# Patient Record
Sex: Male | Born: 1955 | Race: White | Hispanic: No | Marital: Married | Smoking: Never smoker
Health system: Southern US, Community
[De-identification: ages and names within clinical notes are randomized; demographics above are authoritative.]

## PROBLEM LIST (undated history)

## (undated) DIAGNOSIS — G4730 Sleep apnea, unspecified: Secondary | ICD-10-CM

## (undated) DIAGNOSIS — I10 Essential (primary) hypertension: Secondary | ICD-10-CM

## (undated) DIAGNOSIS — I341 Nonrheumatic mitral (valve) prolapse: Secondary | ICD-10-CM

## (undated) DIAGNOSIS — M1990 Unspecified osteoarthritis, unspecified site: Secondary | ICD-10-CM

## (undated) DIAGNOSIS — I742 Embolism and thrombosis of arteries of the upper extremities: Secondary | ICD-10-CM

## (undated) DIAGNOSIS — E785 Hyperlipidemia, unspecified: Secondary | ICD-10-CM

## (undated) DIAGNOSIS — E119 Type 2 diabetes mellitus without complications: Secondary | ICD-10-CM

## (undated) DIAGNOSIS — D689 Coagulation defect, unspecified: Secondary | ICD-10-CM

## (undated) DIAGNOSIS — C4491 Basal cell carcinoma of skin, unspecified: Secondary | ICD-10-CM

## (undated) DIAGNOSIS — J189 Pneumonia, unspecified organism: Secondary | ICD-10-CM

## (undated) DIAGNOSIS — F32A Depression, unspecified: Secondary | ICD-10-CM

## (undated) DIAGNOSIS — F419 Anxiety disorder, unspecified: Secondary | ICD-10-CM

## (undated) DIAGNOSIS — I493 Ventricular premature depolarization: Secondary | ICD-10-CM

## (undated) HISTORY — DX: Embolism and thrombosis of arteries of the upper extremities: I74.2

## (undated) HISTORY — PX: KNEE SURGERY: SHX244

## (undated) HISTORY — DX: Coagulation defect, unspecified: D68.9

## (undated) HISTORY — DX: Essential (primary) hypertension: I10

## (undated) HISTORY — PX: BRAIN SURGERY: SHX531

## (undated) HISTORY — PX: OTHER SURGICAL HISTORY: SHX169

## (undated) HISTORY — DX: Basal cell carcinoma of skin, unspecified: C44.91

---

## 2003-01-05 ENCOUNTER — Encounter: Admission: RE | Admit: 2003-01-05 | Discharge: 2003-01-05 | Payer: Self-pay

## 2004-03-10 ENCOUNTER — Ambulatory Visit (HOSPITAL_COMMUNITY): Admission: RE | Admit: 2004-03-10 | Discharge: 2004-03-10 | Payer: Self-pay | Admitting: Orthopedic Surgery

## 2004-03-10 ENCOUNTER — Ambulatory Visit (HOSPITAL_BASED_OUTPATIENT_CLINIC_OR_DEPARTMENT_OTHER): Admission: RE | Admit: 2004-03-10 | Discharge: 2004-03-10 | Payer: Self-pay | Admitting: Orthopedic Surgery

## 2004-05-17 ENCOUNTER — Ambulatory Visit (HOSPITAL_COMMUNITY): Admission: RE | Admit: 2004-05-17 | Discharge: 2004-05-17 | Payer: Self-pay | Admitting: Cardiology

## 2008-07-22 ENCOUNTER — Other Ambulatory Visit: Payer: Self-pay

## 2008-07-22 ENCOUNTER — Emergency Department: Payer: Self-pay | Admitting: Emergency Medicine

## 2010-10-26 DIAGNOSIS — Z85828 Personal history of other malignant neoplasm of skin: Secondary | ICD-10-CM

## 2010-10-26 HISTORY — DX: Personal history of other malignant neoplasm of skin: Z85.828

## 2012-11-07 ENCOUNTER — Ambulatory Visit: Payer: Self-pay | Admitting: Gastroenterology

## 2015-03-07 ENCOUNTER — Encounter: Payer: Self-pay | Admitting: Podiatry

## 2015-03-07 ENCOUNTER — Ambulatory Visit (INDEPENDENT_AMBULATORY_CARE_PROVIDER_SITE_OTHER): Payer: BLUE CROSS/BLUE SHIELD | Admitting: Podiatry

## 2015-03-07 VITALS — BP 176/95 | HR 78 | Resp 15 | Ht 70.0 in | Wt 230.0 lb

## 2015-03-07 DIAGNOSIS — L600 Ingrowing nail: Secondary | ICD-10-CM | POA: Diagnosis not present

## 2015-03-07 NOTE — Progress Notes (Signed)
   Subjective:    Patient ID: Juan Olson, male    DOB: [birth year], 59 y.o.   MRN: [id number]  HPI Comments: Pt complains of painful left 1st toenail medial border for over 1 month and worsening.     Review of Systems  All other systems reviewed and are negative.      Objective:   Physical Exam        Assessment & Plan:

## 2015-03-07 NOTE — Patient Instructions (Signed)

## 2015-03-08 NOTE — Progress Notes (Signed)
Subjective:     Patient ID: Juan Olson, male   DOB: 01-May-1956, 59 y.o.   MRN: [id number]  HPI patient presents with incurvated nail border left hallux medial side stating that it's been sore and making walking difficult   Review of Systems  All other systems reviewed and are negative.      Objective:   Physical Exam  Cardiovascular: Intact distal pulses.   Musculoskeletal: Normal range of motion.  Neurological: He is alert.  Skin: Skin is warm.  Nursing note and vitals reviewed.  neurovascular status intact with muscle strength adequate and range of motion subtalar midtarsal joint within normal limits. Patient's noted to have incurvated left hallux medial border that's painful when pressed and making shoe gear difficult and has tried wider shoes and soaks in order to try to alleviate pain. Good digital perfusion and is noted and patient well oriented 3     Assessment:     Ingrown toenail deformity left hallux medial border with pain    Plan:     H&P and condition reviewed with patient. Recommended removal of corner and explained procedure and patient understands and wants procedure understanding risk. Today I went ahead and infiltrated the left hallux 60 Milligan times like Marcaine mixture remove the medial border exposed matrix and applied phenol 3 applications 30 seconds followed by alcohol lavage and sterile dressing. Gave instructions on soaks and reappoint

## 2015-03-09 ENCOUNTER — Telehealth: Payer: Self-pay | Admitting: *Deleted

## 2015-03-09 ENCOUNTER — Encounter: Payer: Self-pay | Admitting: Podiatry

## 2015-03-09 NOTE — Telephone Encounter (Signed)
Pt requests note excusing him from work tonight due to the pain from an ingrown toenail procedure on 03/07/2015.

## 2015-04-11 ENCOUNTER — Ambulatory Visit (INDEPENDENT_AMBULATORY_CARE_PROVIDER_SITE_OTHER): Payer: BLUE CROSS/BLUE SHIELD | Admitting: Podiatry

## 2015-04-11 ENCOUNTER — Encounter: Payer: Self-pay | Admitting: Podiatry

## 2015-04-11 VITALS — BP 139/96 | HR 90 | Resp 15

## 2015-04-11 DIAGNOSIS — L600 Ingrowing nail: Secondary | ICD-10-CM | POA: Diagnosis not present

## 2015-04-14 NOTE — Progress Notes (Signed)
Subjective:     Patient ID: Juan Olson, male   DOB: [birth year], 59 y.o.   MRN: [id number]  HPI patient presents stating it's doing pretty well but I was just concerned because her a small amount of drainage   Review of Systems     Objective:   Physical Exam Neurovascular status intact with localized drainage noted left hallux with no proximal edema erythema drainage noted.    Assessment:     Localized paronychia infection left hallux    Plan:     Instructed on changes and soaks and open toed shoes at home and allowing air to get to it. Should heal uneventfully and reappoint as needed

## 2015-07-01 ENCOUNTER — Ambulatory Visit (INDEPENDENT_AMBULATORY_CARE_PROVIDER_SITE_OTHER): Payer: BLUE CROSS/BLUE SHIELD | Admitting: Podiatry

## 2015-07-01 ENCOUNTER — Encounter: Payer: Self-pay | Admitting: Podiatry

## 2015-07-01 VITALS — BP 152/95 | HR 76 | Resp 14

## 2015-07-01 DIAGNOSIS — L600 Ingrowing nail: Secondary | ICD-10-CM

## 2015-07-01 NOTE — Patient Instructions (Addendum)

## 2015-07-06 NOTE — Progress Notes (Signed)
Patient ID: Juan Olson, male   DOB: 1955-12-21, 59 y.o.   MRN: [id number]  Subjective: 59 year old male presents the office with continuation of pain and ingrown toenails of the left hallux. He states the continued his pain but his pressure in shoe gear. He does state that he noticed some drainage a couple days ago with some clear drainage but denies any this time. Denies any pus. Denies any surrounding redness or red streaks or any swelling. He started trimmed area without any relief. No other complaints at this time in no acute changes.  Objective: AAO x3, NAD DP/PT pulses palpable bilaterally, CRT less than 3 seconds Protective sensation intact with Simms Weinstein monofilament Along the medial and lateral aspect left hallux toenail there is significant incurvation tenderness to palpation along the nail borders. There is no overlying edema, erythema, increased warmth. There is no drainage or purulence. The nail does appear to be hypertrophic, dystrophic, discolored. They're significant curving of the toenail. No otherareas of tenderness to bilateral lower extremities. MMT 5/5, ROM WNL.  No open lesions or pre-ulcerative lesions.  No overlying edema, erythema, increase in warmth to bilateral lower extremities.  No pain with calf compression, swelling, warmth, erythema bilaterally.   Assessment: 59 year old male ingrown toenail left medial/lateral nail border.  Plan: -Treatment options discussed including all alternatives, risks, and complications -At this time, the patient is requesting partial nail removal with chemical matricectomy to the symptomatic portion of the nail. I discussed in total nail avulsion however he does not want to proceed with that only partial nail avulsion. He understands that he may require or need a total nail avulsion in the future if his condition reoccurring problem given deformity to the toenail.Risks and complications were discussed with the patient for which  they understand and  verbally consent to the procedure. Under sterile conditions a total of 3 mL of a mixture of 2% lidocaine plain and 0.5% Marcaine plain was infiltrated in a hallux block fashion. Once anesthetized, the skin was prepped in sterile fashion. A tourniquet was then applied. Next the medial and lateral aspect of hallux nail border was then sharply excised making sure to remove the entire offending nail border. Once the nails were ensured to be removed area was debrided and the underlying skin was intact. There is no purulence identified in the procedure. Next phenol was then applied under standard conditions and copiously irrigated. Silvadene was applied. A dry sterile dressing was applied. After application of the dressing the tourniquet was removed and there is found to be an immediate capillary refill time to the digit. The patient tolerated the procedure well any complications. Post procedure instructions were discussed the patient for which he verbally understood. Follow-up in one week for nail check or sooner if any problems are to arise. Discussed signs/symptoms of infection and directed to call the office immediately should any occur or go directly to the emergency room. In the meantime, encouraged to call the office with any questions, concerns, changes symptoms.  Celesta Gentile, DPM

## 2015-07-07 ENCOUNTER — Encounter: Payer: Self-pay | Admitting: Podiatry

## 2015-07-07 ENCOUNTER — Ambulatory Visit (INDEPENDENT_AMBULATORY_CARE_PROVIDER_SITE_OTHER): Payer: BLUE CROSS/BLUE SHIELD | Admitting: Podiatry

## 2015-07-07 VITALS — BP 152/99 | HR 76 | Resp 18

## 2015-07-07 DIAGNOSIS — Z9889 Other specified postprocedural states: Secondary | ICD-10-CM

## 2015-07-07 DIAGNOSIS — L600 Ingrowing nail: Secondary | ICD-10-CM

## 2015-07-07 NOTE — Patient Instructions (Signed)

## 2015-07-08 ENCOUNTER — Encounter: Payer: Self-pay | Admitting: Podiatry

## 2015-07-08 NOTE — Progress Notes (Signed)
Patient ID: Juan Olson, male   DOB: Mar 20, 1956, 59 y.o.   MRN: [id number]  Subjective: 59 year old male presents the office today status post left medial/lateral partial nail avulsion. He states his been soaking his foot in Epson salt soaks cover with antibiotic ointment and a Band-Aid. He states that he is doing better and the pain is significantly improved compared to what it was previously. He denies any surrounding redness or red streaks. Denies any pus. There is minimal bloody drainage at times. No other complaints at this time. Denies any systemic complaints as fevers, chills, nausea, vomiting.  Objective: AAO 3, NAD Neurovascular status intact and unchanged. Status post medial and lateral partial nail avulsion of the left hallux. There is granulation tissue is present within the procedure site. There is no tenderness palpation overlying the area. There is no swelling erythema, edema, increase in warmth, red streaks. No conical signs of infection. No other open lesions or pre-ulcerative lesions. No pain with calf compression, swelling, warmth, erythema.  Assessment: s/p left hallux medial/lateral partial nail avulsion; doing well.   Plan: Continue soaking in epsom salts twice a day followed by antibiotic ointment and a band-aid. Can leave uncovered at night. Continue this until completely healed.  If the area has not healed in 2 weeks, call the office for follow-up appointment, or sooner if any problems arise.  Monitor for any signs/symptoms of infection. Call the office immediately if any occur or go directly to the emergency room. Call with any questions/concerns.  Celesta Gentile, DPM

## 2015-10-27 ENCOUNTER — Other Ambulatory Visit: Payer: Self-pay | Admitting: Orthopedic Surgery

## 2015-10-27 DIAGNOSIS — M5442 Lumbago with sciatica, left side: Secondary | ICD-10-CM

## 2015-10-27 DIAGNOSIS — M5136 Other intervertebral disc degeneration, lumbar region: Secondary | ICD-10-CM

## 2015-11-03 ENCOUNTER — Ambulatory Visit
Admission: RE | Admit: 2015-11-03 | Discharge: 2015-11-03 | Disposition: A | Discharge status: Home / Self Care | Payer: BLUE CROSS/BLUE SHIELD | Source: Ambulatory Visit | Attending: Orthopedic Surgery | Admitting: Orthopedic Surgery

## 2015-11-03 DIAGNOSIS — M4806 Spinal stenosis, lumbar region: Secondary | ICD-10-CM | POA: Insufficient documentation

## 2015-11-03 DIAGNOSIS — I708 Atherosclerosis of other arteries: Secondary | ICD-10-CM | POA: Insufficient documentation

## 2015-11-03 DIAGNOSIS — M5137 Other intervertebral disc degeneration, lumbosacral region: Secondary | ICD-10-CM | POA: Insufficient documentation

## 2015-11-03 DIAGNOSIS — M5442 Lumbago with sciatica, left side: Secondary | ICD-10-CM | POA: Insufficient documentation

## 2015-11-03 DIAGNOSIS — M5136 Other intervertebral disc degeneration, lumbar region: Secondary | ICD-10-CM

## 2016-04-24 ENCOUNTER — Other Ambulatory Visit: Payer: Self-pay | Admitting: Unknown Physician Specialty

## 2016-04-24 ENCOUNTER — Ambulatory Visit
Admission: RE | Admit: 2016-04-24 | Discharge: 2016-04-24 | Disposition: A | Discharge status: Home / Self Care | Payer: BLUE CROSS/BLUE SHIELD | Source: Ambulatory Visit | Attending: Unknown Physician Specialty | Admitting: Unknown Physician Specialty

## 2016-04-24 DIAGNOSIS — J329 Chronic sinusitis, unspecified: Secondary | ICD-10-CM
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
## 2017-04-16 DIAGNOSIS — K625 Hemorrhage of anus and rectum: Secondary | ICD-10-CM | POA: Insufficient documentation

## 2017-10-25 ENCOUNTER — Encounter: Payer: Self-pay | Admitting: *Deleted

## 2017-10-31 ENCOUNTER — Ambulatory Visit
Admission: RE | Admit: 2017-10-31 | Payer: BLUE CROSS/BLUE SHIELD | Source: Ambulatory Visit | Admitting: Gastroenterology

## 2017-10-31 ENCOUNTER — Encounter: Admission: RE | Payer: Self-pay | Source: Ambulatory Visit

## 2017-10-31 HISTORY — DX: Hyperlipidemia, unspecified: E78.5

## 2017-10-31 HISTORY — DX: Nonrheumatic mitral (valve) prolapse: I34.1

## 2017-10-31 SURGERY — COLONOSCOPY WITH PROPOFOL
Anesthesia: General

## 2018-11-18 ENCOUNTER — Ambulatory Visit: Payer: Self-pay | Admitting: Podiatry

## 2018-11-21 DIAGNOSIS — E669 Obesity, unspecified: Secondary | ICD-10-CM | POA: Insufficient documentation

## 2018-11-28 ENCOUNTER — Ambulatory Visit: Payer: Self-pay | Admitting: Podiatry

## 2018-11-28 ENCOUNTER — Ambulatory Visit: Payer: No Typology Code available for payment source | Admitting: Podiatry

## 2018-11-28 ENCOUNTER — Encounter: Payer: Self-pay | Admitting: Podiatry

## 2018-11-28 DIAGNOSIS — M79674 Pain in right toe(s): Secondary | ICD-10-CM | POA: Diagnosis not present

## 2018-11-28 DIAGNOSIS — M79675 Pain in left toe(s): Secondary | ICD-10-CM | POA: Diagnosis not present

## 2018-11-28 DIAGNOSIS — B351 Tinea unguium: Secondary | ICD-10-CM

## 2018-11-28 DIAGNOSIS — L600 Ingrowing nail: Secondary | ICD-10-CM | POA: Diagnosis not present

## 2018-11-28 NOTE — Progress Notes (Signed)
Subjective: Juan Olson presents to the office today for concerns of ingrown toenail on the right big toe, medial aspect.  States is not hurting but he cannot trim the nail he cannot get the corner out he like me to trim the nail.  Denies any current pain he denies any redness or drainage or any swelling to the area.  He has no other concerns. Denies any systemic complaints such as fevers, chills, nausea, vomiting. No acute changes since last appointment, and no other complaints at this time.   Objective: AAO x3, NAD DP/PT pulses palpable bilaterally, CRT less than 3 seconds The nails overall are discolored with yellow discoloration and dystrophic.  The right hallux was mildly hypertrophic but the other nails.  There is mild incurvation present on the medial aspect distally there is no edema, erythema, drainage or pus or any clinical signs of infection.  No open lesions or pre-ulcerative lesions.  No pain with calf compression, swelling, warmth, erythema  Assessment: Right hallux ingrown toenail  Plan: -All treatment options discussed with the patient including all alternatives, risks, complications.  -Discussed partial nail avulsion but he wishes to hold off.  Today he wants to have the nail corner trimmed.  I sharply debrided the nail without any complications or bleeding.  I do Mucinex and salt soaks daily as well as antibiotic ointment for monitoring signs or symptoms of infection. -Patient encouraged to call the office with any questions, concerns, change in symptoms.   Trula Slade DPM

## 2018-11-28 NOTE — Patient Instructions (Signed)
Ingrown Toenail  An ingrown toenail occurs when the corner or sides of a toenail grow into the surrounding skin. This causes discomfort and pain. The big toe is most commonly affected, but any of the toes can be affected. If an ingrown toenail is not treated, it can become infected.  What are the causes?  This condition may be caused by:   Wearing shoes that are too small or tight.   An injury, such as stubbing your toe or having your toe stepped on.   Improper cutting or care of your toenails.   Having nail or foot abnormalities that were present from birth (congenital abnormalities), such as having a nail that is too big for your toe.  What increases the risk?  The following factors may make you more likely to develop ingrown toenails:   Age. Nails tend to get thicker with age, so ingrown nails are more common among older people.   Cutting your toenails incorrectly, such as cutting them very short or cutting them unevenly.  An ingrown toenail is more likely to get infected if you have:   Diabetes.   Blood flow (circulation) problems.  What are the signs or symptoms?  Symptoms of an ingrown toenail may include:   Pain, soreness, or tenderness.   Redness.   Swelling.   Hardening of the skin that surrounds the toenail.  Signs that an ingrown toenail may be infected include:   Fluid or pus.   Symptoms that get worse instead of better.  How is this diagnosed?  An ingrown toenail may be diagnosed based on your medical history, your symptoms, and a physical exam. If you have fluid or blood coming from your toenail, a sample may be collected to test for the specific type of bacteria that is causing the infection.  How is this treated?  Treatment depends on how severe your ingrown toenail is. You may be able to care for your toenail at home.   If you have an infection, you may be prescribed antibiotic medicines.   If you have fluid or pus draining from your toenail, your health care provider may drain  it.   If you have trouble walking, you may be given crutches to use.   If you have a severe or infected ingrown toenail, you may need a procedure to remove part or all of the nail.  Follow these instructions at home:  Foot care     Do not pick at your toenail or try to remove it yourself.   Soak your foot in warm, soapy water. Do this for 20 minutes, 3 times a day, or as often as told by your health care provider. This helps to keep your toe clean and keep your skin soft.   Wear shoes that fit well and are not too tight. Your health care provider may recommend that you wear open-toed shoes while you heal.   Trim your toenails regularly and carefully. Cut your toenails straight across to prevent injury to the skin at the corners of the toenail. Do not cut your nails in a curved shape.   Keep your feet clean and dry to help prevent infection.  Medicines   Take over-the-counter and prescription medicines only as told by your health care provider.   If you were prescribed an antibiotic, take it as told by your health care provider. Do not stop taking the antibiotic even if you start to feel better.  Activity   Return to your normal activities   as told by your health care provider. Ask your health care provider what activities are safe for you.   Avoid activities that cause pain.  General instructions   If your health care provider told you to use crutches to help you move around, use them as instructed.   Keep all follow-up visits as told by your health care provider. This is important.  Contact a health care provider if:   You have more redness, swelling, pain, or other symptoms that do not improve with treatment.   You have fluid, blood, or pus coming from your toenail.  Get help right away if:   You have a red streak on your skin that starts at your foot and spreads up your leg.   You have a fever.  Summary   An ingrown toenail occurs when the corner or sides of a toenail grow into the surrounding  skin. This causes discomfort and pain. The big toe is most commonly affected, but any of the toes can be affected.   If an ingrown toenail is not treated, it can become infected.   Fluid or pus draining from your toenail is a sign of infection. Your health care provider may need to drain it. You may be given antibiotics to treat the infection.   Trimming your toenails regularly and properly can help you prevent an ingrown toenail.  This information is not intended to replace advice given to you by your health care provider. Make sure you discuss any questions you have with your health care provider.  Document Released: 10/19/2000 Document Revised: 07/10/2017 Document Reviewed: 07/10/2017  Elsevier Interactive Patient Education  2019 [employer]

## 2019-06-02 ENCOUNTER — Encounter: Payer: Self-pay | Admitting: Podiatry

## 2019-06-02 ENCOUNTER — Other Ambulatory Visit: Payer: Self-pay

## 2019-06-02 ENCOUNTER — Ambulatory Visit: Payer: No Typology Code available for payment source | Admitting: Podiatry

## 2019-06-02 VITALS — Temp 97.8°F

## 2019-06-02 DIAGNOSIS — L600 Ingrowing nail: Secondary | ICD-10-CM | POA: Diagnosis not present

## 2019-06-02 NOTE — Patient Instructions (Signed)
Ingrown Toenail An ingrown toenail occurs when the corner or sides of a toenail grow into the surrounding skin. This causes discomfort and pain. The big toe is most commonly affected, but any of the toes can be affected. If an ingrown toenail is not treated, it can become infected. What are the causes? This condition may be caused by:  Wearing shoes that are too small or tight.  An injury, such as stubbing your toe or having your toe stepped on.  Improper cutting or care of your toenails.  Having nail or foot abnormalities that were present from birth (congenital abnormalities), such as having a nail that is too big for your toe. What increases the risk? The following factors may make you more likely to develop ingrown toenails:  Age. Nails tend to get thicker with age, so ingrown nails are more common among older people.  Cutting your toenails incorrectly, such as cutting them very short or cutting them unevenly. An ingrown toenail is more likely to get infected if you have:  Diabetes.  Blood flow (circulation) problems. What are the signs or symptoms? Symptoms of an ingrown toenail may include:  Pain, soreness, or tenderness.  Redness.  Swelling.  Hardening of the skin that surrounds the toenail. Signs that an ingrown toenail may be infected include:  Fluid or pus.  Symptoms that get worse instead of better. How is this diagnosed? An ingrown toenail may be diagnosed based on your medical history, your symptoms, and a physical exam. If you have fluid or blood coming from your toenail, a sample may be collected to test for the specific type of bacteria that is causing the infection. How is this treated? Treatment depends on how severe your ingrown toenail is. You may be able to care for your toenail at home.  If you have an infection, you may be prescribed antibiotic medicines.  If you have fluid or pus draining from your toenail, your health care provider may drain it.   If you have trouble walking, you may be given crutches to use.  If you have a severe or infected ingrown toenail, you may need a procedure to remove part or all of the nail. Follow these instructions at home: Foot care   Do not pick at your toenail or try to remove it yourself.  Soak your foot in warm, soapy water. Do this for 20 minutes, 3 times a day, or as often as told by your health care provider. This helps to keep your toe clean and keep your skin soft.  Wear shoes that fit well and are not too tight. Your health care provider may recommend that you wear open-toed shoes while you heal.  Trim your toenails regularly and carefully. Cut your toenails straight across to prevent injury to the skin at the corners of the toenail. Do not cut your nails in a curved shape.  Keep your feet clean and dry to help prevent infection. Medicines  Take over-the-counter and prescription medicines only as told by your health care provider.  If you were prescribed an antibiotic, take it as told by your health care provider. Do not stop taking the antibiotic even if you start to feel better. Activity  Return to your normal activities as told by your health care provider. Ask your health care provider what activities are safe for you.  Avoid activities that cause pain. General instructions  If your health care provider told you to use crutches to help you move around, use them   as instructed.  Keep all follow-up visits as told by your health care provider. This is important. Contact a health care provider if:  You have more redness, swelling, pain, or other symptoms that do not improve with treatment.  You have fluid, blood, or pus coming from your toenail. Get help right away if:  You have a red streak on your skin that starts at your foot and spreads up your leg.  You have a fever. Summary  An ingrown toenail occurs when the corner or sides of a toenail grow into the surrounding skin.  This causes discomfort and pain. The big toe is most commonly affected, but any of the toes can be affected.  If an ingrown toenail is not treated, it can become infected.  Fluid or pus draining from your toenail is a sign of infection. Your health care provider may need to drain it. You may be given antibiotics to treat the infection.  Trimming your toenails regularly and properly can help you prevent an ingrown toenail. This information is not intended to replace advice given to you by your health care provider. Make sure you discuss any questions you have with your health care provider. Document Released: 10/19/2000 Document Revised: 02/13/2019 Document Reviewed: 07/10/2017 Elsevier Patient Education  2020 [employer].

## 2019-06-08 NOTE — Progress Notes (Signed)
Subjective: 63 year old male presents the office today for follow-up evaluation of ingrown toenail.  He states that his nail onset of concrete last Friday and since then he started noticing reoccurrence of ingrown toenail.  After that point he was doing well having no issues with the toenail.  He thinks it just needs to be trimmed out. Denies any systemic complaints such as fevers, chills, nausea, vomiting. No acute changes since last appointment, and no other complaints at this time.   Objective: AAO x3, NAD DP/PT pulses palpable bilaterally, CRT less than 3 seconds Mild incurvation present to the distal aspect of the right hallux toenail on the medial aspect.  There is no edema, erythema.  No drainage or pus or any signs of infection.  No open lesions or pre-ulcerative lesions.  No pain with calf compression, swelling, warmth, erythema  Assessment: Ingrown toenail right medial  Plan: -All treatment options discussed with the patient including all alternatives, risks, complications.  -Discussed with a partial nail avulsion but he wants to hold off on that.  He was doing well up until he hit his toe.  I did sharply debride the symptomatic portion of the ingrown toenail with any complications or bleeding.  Recommend Epson salt soaks daily as well as antibiotic ointment dressing changes.  Monitor for any signs or symptoms of infection. -Patient encouraged to call the office with any questions, concerns, change in symptoms.   Trula Slade DPM

## 2019-08-28 ENCOUNTER — Other Ambulatory Visit: Payer: Self-pay

## 2019-08-28 ENCOUNTER — Encounter: Payer: Self-pay | Admitting: Podiatry

## 2019-08-28 ENCOUNTER — Ambulatory Visit: Payer: Managed Care, Other (non HMO) | Admitting: Podiatry

## 2019-08-28 DIAGNOSIS — L600 Ingrowing nail: Secondary | ICD-10-CM | POA: Diagnosis not present

## 2019-08-28 NOTE — Progress Notes (Signed)
Subjective: 63 year old male presents the office today for recurrent ingrown toenail the right big toe, medial aspect.  Been causing pain daily about several days.  I have seen him previously to have the nail trimmed.  He is interested in have the nail point removed today.Denies any systemic complaints such as fevers, chills, nausea, vomiting. No acute changes since last appointment, and no other complaints at this time.   Objective: AAO x3, NAD DP/PT pulses palpable bilaterally, CRT less than 3 seconds Nails highly hypertrophic and dystrophic.  Incurvation present medial aspect of the right hallux toenail with localized edema but there is no drainage or pus or any fluctuation crepitation any open sores.  No pain with calf compression, swelling, warmth, erythema  Assessment: Chronic ingrown toenail right medial  Plan: -All treatment options discussed with the patient including all alternatives, risks, complications.  -At this time, the patient is requesting partial nail removal with chemical matricectomy to the symptomatic portion of the nail. Risks and complications were discussed with the patient for which they understand and written consent was obtained. Under sterile conditions a total of 3 mL of a mixture of 2% lidocaine plain and 0.5% Marcaine plain was infiltrated in a hallux block fashion. Once anesthetized, the skin was prepped in sterile fashion. A tourniquet was then applied. Next the medial aspect of hallux nail border was then sharply excised making sure to remove the entire offending nail border. Once the nails were ensured to be removed area was debrided and the underlying skin was intact. There is no purulence identified in the procedure. Next phenol was then applied under standard conditions and copiously irrigated. Silvadene was applied. A dry sterile dressing was applied. After application of the dressing the tourniquet was removed and there is found to be an immediate capillary  refill time to the digit. The patient tolerated the procedure well any complications. Post procedure instructions were discussed the patient for which he verbally understood. Follow-up in one week for nail check or sooner if any problems are to arise. Discussed signs/symptoms of infection and directed to call the office immediately should any occur or go directly to the emergency room. In the meantime, encouraged to call the office with any questions, concerns, changes symptoms. -He has been trim the left hallux toenail treated today as a courtesy.  This was completed without any complications or bleeding. -Patient encouraged to call the office with any questions, concerns, change in symptoms.   Trula Slade DPM

## 2019-08-28 NOTE — Patient Instructions (Signed)

## 2019-09-07 ENCOUNTER — Ambulatory Visit (INDEPENDENT_AMBULATORY_CARE_PROVIDER_SITE_OTHER): Payer: Medicare Other | Admitting: Podiatry

## 2019-09-07 ENCOUNTER — Other Ambulatory Visit: Payer: Self-pay

## 2019-09-07 DIAGNOSIS — L600 Ingrowing nail: Secondary | ICD-10-CM

## 2019-09-07 NOTE — Patient Instructions (Signed)

## 2019-09-07 NOTE — Progress Notes (Signed)
Subjective: Juan Olson is a 63 y.o.  male returns to office today for follow up evaluation after having right hallux medial nail avulsion performed. Patient has been soaking using epsom salts and applying topical antibiotic covered with bandaid daily.  States he has some clear drainage for the first 2 days but that resolved.  No purulence.  His pain is also improving.  He has no concerns.  Patient denies fevers, chills, nausea, vomiting. Denies any calf pain, chest pain, SOB.   Objective:  Vitals: Reviewed  General: Well developed, nourished, in no acute distress, alert and oriented x3   Dermatology: Skin is warm, dry and supple bilateral. Right hallux nail border appears to be clean, dry, with mild granular tissue and surrounding scab. There is no surrounding erythema, edema, drainage/purulence. The remaining nails appear unremarkable at this time. There are no other lesions or other signs of infection present.  Neurovascular status: Intact. No lower extremity swelling; No pain with calf compression bilateral.  Musculoskeletal: No tenderness to palpation of the right hallux nail fold. Muscular strength within normal limits bilateral.   Assesement and Plan: S/p partial nail avulsion, doing well.   -Continue soaking in epsom salts twice a day followed by antibiotic ointment and a band-aid. Can leave uncovered at night. Continue this until completely healed.  -If the area has not healed in 2 weeks, call the office for follow-up appointment, or sooner if any problems arise.  -Monitor for any signs/symptoms of infection. Call the office immediately if any occur or go directly to the emergency room. Call with any questions/concerns.  Celesta Gentile, DPM

## 2019-09-08 ENCOUNTER — Ambulatory Visit: Payer: Managed Care, Other (non HMO) | Admitting: Podiatry

## 2020-03-08 ENCOUNTER — Ambulatory Visit: Payer: Medicare Other | Admitting: Podiatry

## 2020-03-08 ENCOUNTER — Other Ambulatory Visit: Payer: Self-pay

## 2020-03-08 DIAGNOSIS — B351 Tinea unguium: Secondary | ICD-10-CM | POA: Diagnosis not present

## 2020-03-08 DIAGNOSIS — M79674 Pain in right toe(s): Secondary | ICD-10-CM

## 2020-03-08 DIAGNOSIS — L603 Nail dystrophy: Secondary | ICD-10-CM

## 2020-03-08 DIAGNOSIS — M79675 Pain in left toe(s): Secondary | ICD-10-CM | POA: Diagnosis not present

## 2020-03-09 ENCOUNTER — Encounter: Payer: Self-pay | Admitting: Podiatry

## 2020-03-09 NOTE — Progress Notes (Signed)
  Subjective:  Patient ID: Juan Olson, male    DOB: 1956-05-29,  MRN: [id number]  Chief Complaint  Patient presents with  . Ankle Pain    pt is here for a possible ingrown of the right great toenail medial side, pt states that the it doesn't hurt, but is looking to get it trimmed down   64 y.o. male returns for the above complaint.  Patient presents with thickened elongated dystrophic toenails x10.  Patient states is mildly painful when ambulating.  He has not been able to debride himself down.  Patient states that there is no bruising or swelling associated with it.  He is also worried about the right great toenail that is ingrown and is causing some pain.  Patient was last seen by Dr. Earleen Newport who did a slant back procedure which seems to be helping.  He would like to know if this could be done again as it does give him a lot of pain relief.  He does not want to have it removed.  Objective:  There were no vitals filed for this visit. Podiatric Exam: Vascular: dorsalis pedis and posterior tibial pulses are palpable bilateral. Capillary return is immediate. Temperature gradient is WNL. Skin turgor WNL  Sensorium: Normal Semmes Weinstein monofilament test. Normal tactile sensation bilaterally. Nail Exam: Pt has thick disfigured discolored nails with subungual debris noted bilateral entire nail hallux through fifth toenails.  Pain on palpation to the nails.  Ingrown medial border of the right hallux noted.  No clinical signs of infection noted. Ulcer Exam: There is no evidence of ulcer or pre-ulcerative changes or infection. Orthopedic Exam: Muscle tone and strength are WNL. No limitations in general ROM. No crepitus or effusions noted. HAV  B/L.  Hammer toes 2-5  B/L. Skin: No Porokeratosis. No infection or ulcers    Assessment & Plan:   1. Nail dystrophy   2. Pain due to onychomycosis of toenails of both feet     Patient was evaluated and treated and all questions answered.  Right  hallux medial border ingrown/nail dystrophy -I explained to the patient the etiology of ingrown toenail and various treatment options were extensively discussed.  At this time patient would like to hold off on any ingrown nail procedure as he may be traveling in the near future.  He will discuss think about it and will return and we will may consider doing a procedure. -Slant back procedure was performed which gave him temporary relief.  No complications noted.  Onychomycosis with pain  -Nails palliatively debrided as below. -Educated on self-care  Procedure: Nail Debridement Rationale: pain  Type of Debridement: manual, sharp debridement. Instrumentation: Nail nipper, rotary burr. Number of Nails: 10  Procedures and Treatment: Consent by patient was obtained for treatment procedures. The patient understood the discussion of treatment and procedures well. All questions were answered thoroughly reviewed. Debridement of mycotic and hypertrophic toenails, 1 through 5 bilateral and clearing of subungual debris. No ulceration, no infection noted.  Return Visit-Office Procedure: Patient instructed to return to the office for a follow up visit 3 months for continued evaluation and treatment.  Boneta Lucks, DPM    No follow-ups on file.

## 2020-05-06 ENCOUNTER — Encounter: Payer: Medicare Other | Attending: Physician Assistant | Admitting: Dietician

## 2020-05-06 ENCOUNTER — Other Ambulatory Visit: Payer: Self-pay

## 2020-05-06 ENCOUNTER — Encounter: Payer: Self-pay | Admitting: Dietician

## 2020-05-06 VITALS — Ht 69.0 in | Wt 240.4 lb

## 2020-05-06 DIAGNOSIS — I10 Essential (primary) hypertension: Secondary | ICD-10-CM | POA: Diagnosis not present

## 2020-05-06 DIAGNOSIS — G4730 Sleep apnea, unspecified: Secondary | ICD-10-CM | POA: Diagnosis not present

## 2020-05-06 DIAGNOSIS — E119 Type 2 diabetes mellitus without complications: Secondary | ICD-10-CM | POA: Diagnosis present

## 2020-05-06 NOTE — Patient Instructions (Signed)
·   If you eat some bread, traditional sourdough (not quick processed), or sprouted grain bread are better for blood sugar control and have some probiotic benefit.   Great job including vegetables and friuts and other good sources of fiber and nutrition, and limiting processed carb/ starchy foods!  Aim for at least 15-30grams of carbohydrate with each meal, from fruit, whole grain, beans, healthy bread, etc. Some carb on a regular basis helps exercise benefit you more also with enhanced muscle development.

## 2020-05-06 NOTE — Progress Notes (Signed)
Medical Nutrition Therapy: Visit start time: 0272  end time: 1145  Assessment:  Diagnosis: Type 2 diabetes, HTN, sleep apnea Past medical history: hyperlipidemia Psychosocial issues/ stress concerns: PTSD  Preferred learning method:  . Auditory-- discussion .   Current weight: 240.4lbs  Height: 5'9" Medications, supplements: reconciled list in medical record; taking several supplements including probiotic, vitamin C, magnesium, chromium, turmeric, cinnamon, vitamin E, combo powder including vitamin c, lysine, and zinc  Progress and evaluation:   Patient reports increase in HbA1C recently, which he feels is due to his retirement and decrease in physical activity over the past year.   He has made diet change in the past several weeks and has resumed regular exercise. He reports weight loss of 8lbs.   He is not testing BGs at this time as regular checks were increasing his stress/ anxiety level. He plans to monitor HbA1C results over time as measure of BG control.  He has had diabetes and dietary education in the past, states he has basic knowledge of what he needs to eat.  Physical activity: recumbent bike 40 minutes, 6x a week  Dietary Intake:  Usual eating pattern includes 3 meals and 2-3 snacks per day. Dining out frequency: 0 meals per week.  Breakfast: drinks  + 1/2c fiber one cereal with blueberries and milk; atkins shake; oatmeal with berries; boiled eggs Snack: protein drink with fiber am; occasionally nuts Lunch: same options as breakfast, often Atkins shake if not at breakfast Snack: grapefruit; sometimes nuts Supper: meat + veg ie broccoli, cabbage, brussels sprouts, 3-bean salad + tossed salad; avoids potatoes, no fried foods or processed meats; occ pasta, limits portion Snack: mixed nuts and sunflower seeds Beverages: water, 4-6oz coffee with half&half in am, protein drinks; occ crystal light lemonade  Nutrition Care Education: Topics covered:  Basic nutrition: basic  food groups, appropriate nutrient balance, appropriate meal and snack schedule, general nutrition guidelines   Weight control: estimated energy needs for weight loss at 1800kcal, provided guidance for 40% CHO, 30% protein, 30% fat Diabetes: appropriate carb intake and balance, healthy carb choices, role of fiber, protein, fat; effects of exercise on BGs and mechanism of action  Nutritional Diagnosis:  [place]-2.2 Altered nutrition-related laboratory As related to type 2 diabetes.  As evidenced by recent HbA1C of 7.6%. -3.3 Overweight/obesity As related to history of excess calories and inadequate physical activity.  As evidenced by patient with current BMI of 35, following low-carb diet for ongoing weight loss as well as BG control.  Intervention:   Instruction and discussion as noted above.  Patient has made significant changes to improve BG control and lose weight; he is motivated to continue.   Encouraged inclusion of some healthy carb food(s) with each meal for more consistent intake and to meet basic nutritional needs. Provided plan for lower yet healthy level of carb intake.   Established goals for additional change with direction from patient.  No follow-up scheduled at this time; patient is planning to stop by for weekly weight checks and will schedule another visit later if needed.  Education Materials given:  . Plate Planner with food lists, sample meal pattern . Goals/ instructions   Learner/ who was taught:  . Patient   Level of understanding: Mar[place] Kitchen Verbalizes/ demonstrates competency   Demonstrated degree of understanding via:   Teach back Learning barriers: . None  Willingness to learn/ readiness for change: . Eager, change in progress   Monitoring and Evaluation:  Dietary intake, exercise, BG control, and body weight  follow up: prn

## 2020-05-13 ENCOUNTER — Encounter: Payer: Self-pay | Admitting: Dietician

## 2020-05-13 NOTE — Progress Notes (Signed)
Received and responded to email from patient; copy is below:  Hi Juan Olson, That is awesome! I'm so glad you are getting some pain relief! With less pain, your blood sugars are likely to improve also.  I will keep your good results in mind as I consult with others in similar situations.  Thanks for sharing, Juan Olson  -----Original Message----- From: Juan [email or web address]>  Sent: Friday, May 13, 2020 10:50 AM To: Juan [email or web address]> Subject: Cbd  *Caution - External email - see footer for warnings*  Good morning Juan Olson, saw you last Friday morning, on very creaky knees. I've been using CBD oil 16mg  per day and I'm completely without pain this morning, today is day 6. I've changed nothing else in my routine just this addition, my knees feel as good as they've been in years.  There's no THC in this bottle as well it's straight CBD. Just a note in case you have other patients that are desperate with bad knees or joints, if they're diabetic they're probably overweight like me and likely to have aches and pains. If my knees hurt I can't do the gym and it kills my whole week. I'm a real skeptic as well with things like this, but my knees tell me it's working, with zero side effects and I'm sleeping better as well. I've even knocked down my Motrin usage per day, it's not needed. I also discussed this with my Dr last year, but I dismissed it as snake oil at the time, but he saw no harm in trying it. So a year later....  Thanks, Juan Olson  Sent from my iPad WARNING: This email originated outside of [facility]. Even if this looks like a [employer], it is not. Do not provide your username, password, or any other personal information in response to this or any other email. [facility] will never ask you for your username or password via email. DO NOT CLICK links or attachments unless you are positive the content is safe. If in doubt about the safety of this message, select  the [employer] button, which forwards to IT Security.

## 2020-06-16 ENCOUNTER — Telehealth: Payer: Self-pay | Admitting: Dietician

## 2020-06-16 NOTE — Telephone Encounter (Signed)
Returned message from patient regarding question. He reports having some "swimmy - headed" sensation during the past 3-4 afternoons, to the point it leads to stomach upset. He is not checking BGs at home so does not know if it could be related to BG dropping. He eats a light breakfast, then goes to gym for workout, then a light lunch. Feels bad a few hours later.  Advised him to drink 4oz juice or regular soda at onset of symptoms and allow 10-15 minutes to determine if symptoms improve. If so, advised eating snack within 30 minutes. Discussed other possible causes, and encouraged patient to consult with PCP if symptoms continue.

## 2021-04-27 ENCOUNTER — Other Ambulatory Visit: Payer: Self-pay

## 2021-04-27 ENCOUNTER — Encounter: Payer: Self-pay | Admitting: Dermatology

## 2021-04-27 ENCOUNTER — Ambulatory Visit: Payer: Medicare Other | Admitting: Dermatology

## 2021-04-27 DIAGNOSIS — L821 Other seborrheic keratosis: Secondary | ICD-10-CM

## 2021-04-27 DIAGNOSIS — Z85828 Personal history of other malignant neoplasm of skin: Secondary | ICD-10-CM

## 2021-04-27 DIAGNOSIS — D1800 Hemangioma unspecified site: Secondary | ICD-10-CM

## 2021-04-27 DIAGNOSIS — L738 Other specified follicular disorders: Secondary | ICD-10-CM | POA: Diagnosis not present

## 2021-04-27 DIAGNOSIS — D229 Melanocytic nevi, unspecified: Secondary | ICD-10-CM

## 2021-04-27 DIAGNOSIS — L719 Rosacea, unspecified: Secondary | ICD-10-CM | POA: Diagnosis not present

## 2021-04-27 DIAGNOSIS — L720 Epidermal cyst: Secondary | ICD-10-CM

## 2021-04-27 DIAGNOSIS — L578 Other skin changes due to chronic exposure to nonionizing radiation: Secondary | ICD-10-CM

## 2021-04-27 DIAGNOSIS — D170 Benign lipomatous neoplasm of skin and subcutaneous tissue of head, face and neck: Secondary | ICD-10-CM | POA: Diagnosis not present

## 2021-04-27 NOTE — Patient Instructions (Signed)

## 2021-04-27 NOTE — Progress Notes (Signed)
   New Patient Visit  Subjective  Juan MCCLANAHAN is a 65 y.o. male who presents for the following: check spot (L nose, just noticed 2 wks ago).  He has other areas to be evaluated today.  The following portions of the chart were reviewed this encounter and updated as appropriate:   Tobacco  Allergies  Meds  Problems  Med Hx  Surg Hx  Fam Hx      Review of Systems:  No other skin or systemic complaints except as noted in HPI or Assessment and Plan.  Objective  Well appearing patient in no apparent distress; mood and affect are within normal limits.  All skin waist up examined.  face Smooth white papule(s).   face Yellow lobulated paps  face Erythema and telangiectasias face  L lat forehead Soft rubbery pap 3.5cm   Assessment & Plan   History of Basal Cell Carcinoma of the Skin - No evidence of recurrence today - Recommend regular full body skin exams - Recommend daily broad spectrum sunscreen SPF 30+ to sun-exposed areas, reapply every 2 hours as needed.  - Call if any new or changing lesions are noted between office visits  - R temple hairline  Hemangiomas - Red papules - Discussed benign nature - Observe - Call for any changes  Seborrheic Keratoses - Stuck-on, waxy, tan-brown papules and/or plaques  - Benign-appearing - Discussed benign etiology and prognosis. - Observe - Call for any changes  Milia Face Benign appearing.  Observe.  Sebaceous hyperplasia face Benign appearing, observe.    Rosacea face Rosacea is a chronic progressive skin condition usually affecting the face of adults, causing redness and/or acne bumps. It is treatable but not curable. It sometimes affects the eyes (ocular rosacea) as well. It may respond to topical and/or systemic medication and can flare with stress, sun exposure, alcohol, exercise and some foods.  Daily application of broad spectrum spf 30+ sunscreen to face is recommended to reduce flares.   Discussed  BBL/laser.  Patient declines at this time.  Lipoma of face L lat forehead Benign appearing, observe.  Discussed surgical option.  Patient declines at this time.  Melanocytic Nevi - Tan-brown and/or pink-flesh-colored symmetric macules and papules - Benign appearing on exam today - Observation - Call clinic for new or changing moles - Recommend daily use of broad spectrum spf 30+ sunscreen to sun-exposed areas.   Actinic Damage - chronic, secondary to cumulative UV radiation exposure/sun exposure over time - diffuse scaly erythematous macules with underlying dyspigmentation - Recommend daily broad spectrum sunscreen SPF 30+ to sun-exposed areas, reapply every 2 hours as needed.  - Recommend staying in the shade or wearing long sleeves, sun glasses (UVA+UVB protection) and wide brim hats (4-inch brim around the entire circumference of the hat). - Call for new or changing lesions.  Return for 1-2 yrs for TBSE.  I, Othelia Pulling, RMA, am acting as scribe for Sarina Ser, MD . Documentation: I have reviewed the above documentation for accuracy and completeness, and I agree with the above.  Sarina Ser, MD

## 2021-05-04 ENCOUNTER — Other Ambulatory Visit: Payer: Self-pay

## 2021-05-04 ENCOUNTER — Ambulatory Visit: Payer: Medicare Other | Admitting: Podiatry

## 2021-05-04 DIAGNOSIS — L600 Ingrowing nail: Secondary | ICD-10-CM

## 2021-05-04 DIAGNOSIS — L603 Nail dystrophy: Secondary | ICD-10-CM | POA: Diagnosis not present

## 2021-05-09 ENCOUNTER — Encounter: Payer: Self-pay | Admitting: Podiatry

## 2021-05-09 NOTE — Progress Notes (Signed)
  Subjective:  Patient ID: Juan Olson, male    DOB: 1956/02/08,  MRN: [id number]  Chief Complaint  Patient presents with   Ingrown Toenail    R great ingrown toenail   65 y.o. male returns for the above complaint.  Patient presents with thickened elongated dystrophic toenails x10.  Patient states is mildly painful when ambulating.  He has not been able to debride himself down.  Patient states that there is no bruising or swelling associated with it.  He is also worried about the right great toenail that is ingrown and is causing some pain.  Patient was seen last by me.  I did a slant back procedure which seems to be helping.  We will continue to do that for now.  He does not want to have it removed  Objective:  There were no vitals filed for this visit. Podiatric Exam: Vascular: dorsalis pedis and posterior tibial pulses are palpable bilateral. Capillary return is immediate. Temperature gradient is WNL. Skin turgor WNL  Sensorium: Normal Semmes Weinstein monofilament test. Normal tactile sensation bilaterally. Nail Exam: Pt has thick disfigured discolored nails with subungual debris noted bilateral entire nail hallux through fifth toenails.  Pain on palpation to the nails.  Ingrown medial border of the right hallux noted.  No clinical signs of infection noted. Ulcer Exam: There is no evidence of ulcer or pre-ulcerative changes or infection. Orthopedic Exam: Muscle tone and strength are WNL. No limitations in general ROM. No crepitus or effusions noted. HAV  B/L.  Hammer toes 2-5  B/L. Skin: No Porokeratosis. No infection or ulcers    Assessment & Plan:   1. Nail dystrophy   2. Ingrown toenail      Patient was evaluated and treated and all questions answered.  Right hallux medial border ingrown/nail dystrophy -I explained to the patient the etiology of ingrown toenail and various treatment options were extensively discussed.  At this time patient would like to hold off on any  ingrown nail procedure as he may be traveling in the near future.  He will discuss think about it and will return and we will may consider doing a procedure. -Continue slant back procedure was performed which gave him temporary relief.  No complications noted.  Onychomycosis with pain  -Nails palliatively debrided as below. -Educated on self-care  Procedure: Nail Debridement Rationale: pain  Type of Debridement: manual, sharp debridement. Instrumentation: Nail nipper, rotary burr. Number of Nails: 10  Procedures and Treatment: Consent by patient was obtained for treatment procedures. The patient understood the discussion of treatment and procedures well. All questions were answered thoroughly reviewed. Debridement of mycotic and hypertrophic toenails, 1 through 5 bilateral and clearing of subungual debris. No ulceration, no infection noted.  Return Visit-Office Procedure: Patient instructed to return to the office for a follow up visit 3 months for continued evaluation and treatment.  Boneta Lucks, DPM    No follow-ups on file.

## 2021-05-11 ENCOUNTER — Encounter: Payer: Self-pay | Admitting: Dermatology

## 2021-06-02 ENCOUNTER — Other Ambulatory Visit: Payer: Self-pay

## 2021-06-02 ENCOUNTER — Encounter
Admission: RE | Disposition: A | Discharge status: Home / Self Care | Payer: Self-pay | Source: Home / Self Care | Attending: Gastroenterology

## 2021-06-02 ENCOUNTER — Ambulatory Visit
Admission: RE | Admit: 2021-06-02 | Discharge: 2021-06-02 | Disposition: A | Discharge status: Home / Self Care | Payer: Medicare Other | Attending: Gastroenterology | Admitting: Gastroenterology

## 2021-06-02 ENCOUNTER — Ambulatory Visit: Payer: Medicare Other | Admitting: Anesthesiology

## 2021-06-02 ENCOUNTER — Encounter: Payer: Self-pay | Admitting: *Deleted

## 2021-06-02 DIAGNOSIS — I10 Essential (primary) hypertension: Secondary | ICD-10-CM | POA: Diagnosis not present

## 2021-06-02 DIAGNOSIS — D124 Benign neoplasm of descending colon: Secondary | ICD-10-CM | POA: Insufficient documentation

## 2021-06-02 DIAGNOSIS — E785 Hyperlipidemia, unspecified: Secondary | ICD-10-CM | POA: Diagnosis not present

## 2021-06-02 DIAGNOSIS — Z79899 Other long term (current) drug therapy: Secondary | ICD-10-CM | POA: Insufficient documentation

## 2021-06-02 DIAGNOSIS — K625 Hemorrhage of anus and rectum: Secondary | ICD-10-CM | POA: Insufficient documentation

## 2021-06-02 DIAGNOSIS — K640 First degree hemorrhoids: Secondary | ICD-10-CM | POA: Insufficient documentation

## 2021-06-02 DIAGNOSIS — K5730 Diverticulosis of large intestine without perforation or abscess without bleeding: Secondary | ICD-10-CM | POA: Diagnosis not present

## 2021-06-02 HISTORY — PX: COLONOSCOPY WITH PROPOFOL: SHX5780

## 2021-06-02 SURGERY — COLONOSCOPY WITH PROPOFOL
Anesthesia: Monitor Anesthesia Care

## 2021-06-02 MED ORDER — SODIUM CHLORIDE 0.9 % IV SOLN: INTRAVENOUS | Status: DC

## 2021-06-02 MED ORDER — PROPOFOL 10 MG/ML IV BOLUS
INTRAVENOUS | Status: DC | PRN
  Administered 2021-06-02: 100 mg via INTRAVENOUS
  Administered 2021-06-02 (×2): 20 mg via INTRAVENOUS

## 2021-06-02 MED ORDER — PROPOFOL 500 MG/50ML IV EMUL
INTRAVENOUS | Status: DC | PRN
  Administered 2021-06-02: 100 ug/kg/min via INTRAVENOUS

## 2021-06-02 MED ORDER — PROPOFOL 500 MG/50ML IV EMUL
INTRAVENOUS | Status: AC
  Filled 2021-06-02: qty 50

## 2021-06-02 MED ORDER — LIDOCAINE HCL (CARDIAC) PF 100 MG/5ML IV SOSY
PREFILLED_SYRINGE | INTRAVENOUS | Status: DC | PRN
Start: 2021-06-02 — End: 2021-06-02
  Administered 2021-06-02: 50 mg via INTRAVENOUS

## 2021-06-02 NOTE — Anesthesia Postprocedure Evaluation (Signed)
Anesthesia Post Note  Patient: Juan Olson  Procedure(s) Performed: COLONOSCOPY WITH PROPOFOL  Patient location during evaluation: PACU Anesthesia Type: MAC Level of consciousness: awake and alert Pain management: pain level controlled Vital Signs Assessment: post-procedure vital signs reviewed and stable Respiratory status: spontaneous breathing Cardiovascular status: blood pressure returned to baseline Postop Assessment: no headache Anesthetic complications: no   No notable events documented.   Last Vitals:  Vitals:   06/02/21 0910 06/02/21 0920  BP: (!) 131/55 124/63  Pulse: (!) 50 79  Resp: 17 17  Temp:    SpO2: 96% 98%    Last Pain:  Vitals:   06/02/21 0850  TempSrc: Temporal                 Milinda Pointer

## 2021-06-02 NOTE — Anesthesia Preprocedure Evaluation (Signed)
Anesthesia Evaluation  Patient identified by MRN, date of birth, ID band Patient awake    Airway Mallampati: II  TM Distance: >3 FB Neck ROM: Full    Dental no notable dental hx.    Pulmonary neg pulmonary ROS,    Pulmonary exam normal - rhonchi (-) wheezing      Cardiovascular hypertension, Normal cardiovascular exam Rhythm:Regular Rate:Normal - Systolic murmurs and - Diastolic murmurs    Neuro/Psych negative neurological ROS  negative psych ROS   GI/Hepatic negative GI ROS, Neg liver ROS,   Endo/Other  negative endocrine ROS  Renal/GU negative Renal ROS  negative genitourinary   Musculoskeletal negative musculoskeletal ROS (+)   Abdominal Normal abdominal exam  (+)   Peds negative pediatric ROS (+)  Hematology negative hematology ROS (+)   Anesthesia Other Findings   Reproductive/Obstetrics                             Anesthesia Physical Anesthesia Plan  ASA: 2  Anesthesia Plan: MAC   Post-op Pain Management:    Induction: Intravenous  PONV Risk Score and Plan: 0 and Propofol infusion and TIVA  Airway Management Planned: Mask  Additional Equipment:   Intra-op Plan:   Post-operative Plan:   Informed Consent: I have reviewed the patients History and Physical, chart, labs and discussed the procedure including the risks, benefits and alternatives for the proposed anesthesia with the patient or authorized representative who has indicated his/her understanding and acceptance.     Dental advisory given and History available from chart only  Plan Discussed with: CRNA and Anesthesiologist  Anesthesia Plan Comments: (IVGA, PIV x 1, ASA SM, RBO discussed, Consent signed.)        Anesthesia Quick Evaluation

## 2021-06-02 NOTE — Op Note (Signed)
[facility] Patient Name: Juan Olson Procedure Date: 06/02/2021 8:26 AM MRN: [id number] Account #: [id number] Date of Birth: 1956-09-29 Admit Type: Outpatient Age: 65 Room: [facility] ENDO ROOM 3 Gender: Male Note Status: Finalized Procedure:             Colonoscopy Indications:           Rectal bleeding Providers:             Andrey Farmer MD, MD Medicines:             Monitored Anesthesia Care Complications:         No immediate complications. Estimated blood loss:                         Minimal. Procedure:             Pre-Anesthesia Assessment:                        - Prior to the procedure, a History and Physical was                         performed, and patient medications and allergies were                         reviewed. The patient is competent. The risks and                         benefits of the procedure and the sedation options and                         risks were discussed with the patient. All questions                         were answered and informed consent was obtained.                         Patient identification and proposed procedure were                         verified by the physician, the nurse, the anesthetist                         and the technician in the endoscopy suite. Mental                         Status Examination: alert and oriented. Airway                         Examination: normal oropharyngeal airway and neck                         mobility. Respiratory Examination: clear to                         auscultation. CV Examination: normal. Prophylactic                         Antibiotics: The patient does not require prophylactic  antibiotics. Prior Anticoagulants: The patient has                         taken no previous anticoagulant or antiplatelet                         agents. ASA Grade Assessment: II - A patient with mild                         systemic disease. After  reviewing the risks and                         benefits, the patient was deemed in satisfactory                         condition to undergo the procedure. The anesthesia                         plan was to use monitored anesthesia care (MAC).                         Immediately prior to administration of medications,                         the patient was re-assessed for adequacy to receive                         sedatives. The heart rate, respiratory rate, oxygen                         saturations, blood pressure, adequacy of pulmonary                         ventilation, and response to care were monitored                         throughout the procedure. The physical status of the                         patient was re-assessed after the procedure.                        After obtaining informed consent, the colonoscope was                         passed under direct vision. Throughout the procedure,                         the patient's blood pressure, pulse, and oxygen                         saturations were monitored continuously. The                         Colonoscope was introduced through the anus and                         advanced to the the terminal ileum. The colonoscopy  was performed without difficulty. The patient                         tolerated the procedure well. The quality of the bowel                         preparation was good. Findings:      The perianal and digital rectal examinations were normal.      The terminal ileum appeared normal.      Multiple small-mouthed diverticula were found in the sigmoid colon,       descending colon, transverse colon, hepatic flexure and ascending colon.      A 1 mm polyp was found in the descending colon. The polyp was sessile.       The polyp was removed with a jumbo cold forceps. Resection and retrieval       were complete. Estimated blood loss was minimal.      Internal hemorrhoids were found  during retroflexion. The hemorrhoids       were Grade I (internal hemorrhoids that do not prolapse).      The exam was otherwise without abnormality on direct and retroflexion       views. Impression:            - The examined portion of the ileum was normal.                        - Diverticulosis in the sigmoid colon, in the                         descending colon, in the transverse colon, at the                         hepatic flexure and in the ascending colon.                        - One 1 mm polyp in the descending colon, removed with                         a jumbo cold forceps. Resected and retrieved.                        - Internal hemorrhoids.                        - The examination was otherwise normal on direct and                         retroflexion views. Recommendation:        - Discharge patient to home.                        - Resume previous diet.                        - Continue present medications.                        - Await pathology results.                        -  Repeat colonoscopy in 10 years for surveillance.                        - Return to referring physician as previously                         scheduled. Procedure Code(s):     --- Professional ---                        [phone], Colonoscopy, flexible; with biopsy, single or                         multiple Diagnosis Code(s):     --- Professional ---                        K64.0, First degree hemorrhoids                        K63.5, Polyp of colon                        K62.5, Hemorrhage of anus and rectum                        K57.30, Diverticulosis of large intestine without                         perforation or abscess without bleeding CPT copyright 2019 American Medical Association. All rights reserved. The codes documented in this report are preliminary and upon coder review may  be revised to meet current compliance requirements. Andrey Farmer MD, MD 06/02/2021 8:53:46 AM Number  of Addenda: 0 Note Initiated On: 06/02/2021 8:26 AM Scope Withdrawal Time: 0 hours 9 minutes 28 seconds  Total Procedure Duration: 0 hours 14 minutes 30 seconds  Estimated Blood Loss:  Estimated blood loss was minimal.      [facility]

## 2021-06-02 NOTE — Interval H&P Note (Signed)
History and Physical Interval Note:  06/02/2021 8:28 AM  Juan Olson  has presented today for surgery, with the diagnosis of rectal bleeding.  The various methods of treatment have been discussed with the patient and family. After consideration of risks, benefits and other options for treatment, the patient has consented to  Procedure(s): COLONOSCOPY WITH PROPOFOL (N/A) as a surgical intervention.  The patient's history has been reviewed, patient examined, no change in status, stable for surgery.  I have reviewed the patient's chart and labs.  Questions were answered to the patient's satisfaction.     Lesly Rubenstein  Ok to proceed with colonoscopy

## 2021-06-02 NOTE — Transfer of Care (Signed)
Immediate Anesthesia Transfer of Care Note  Patient: Juan Olson  Procedure(s) Performed: COLONOSCOPY WITH PROPOFOL  Patient Location: PACU  Anesthesia Type:General  Level of Consciousness: awake, alert  and oriented  Airway & Oxygen Therapy: Patient Spontanous Breathing and Patient connected to nasal cannula oxygen  Post-op Assessment: Report given to RN and Post -op Vital signs reviewed and stable  Post vital signs: Reviewed and stable  Last Vitals:  Vitals Value Taken Time  BP 109/70 06/02/21 0854  Temp    Pulse 68 06/02/21 0855  Resp 17 06/02/21 0855  SpO2 97 % 06/02/21 0855  Vitals shown include unvalidated device data.  Last Pain:  Vitals:   06/02/21 0739  TempSrc: Temporal         Complications: No notable events documented.

## 2021-06-02 NOTE — H&P (Signed)
Outpatient short stay form Pre-procedure 06/02/2021 8:22 AM Juan Miyamoto MD, MPH  Primary Physician: PA Tobie Lords  Reason for visit:  Rectal bleeding  History of present illness:   65 y/o gentleman with history of hypertension and HLD here for colonoscopy due to rectal bleeding. Last colonoscopy in 2013. No significant abdominal surgeries. No blood thinners. No family history of GI malignancies.    Current Facility-Administered Medications:    0.9 %  sodium chloride infusion, , Intravenous, Continuous, Areliz Rothman, Hilton Cork, MD, Last Rate: 20 mL/hr at 06/02/21 0759, New Bag at 06/02/21 0759  Medications Prior to Admission  Medication Sig Dispense Refill Last Dose   ALPRAZolam (XANAX) 0.25 MG tablet Take 0.25 mg by mouth at bedtime as needed for anxiety.   06/01/2021   Ascorbic Acid (VITAMIN C) 500 MG CAPS Take 1 capsule by mouth 2 (two) times daily.   Past Week   Bacillus Coagulans-Inulin (PROBIOTIC-PREBIOTIC PO) Take by mouth.   Past Week   Chromium 200 MCG TABS Take by mouth.   Past Week   Cinnamon 500 MG TABS Take 1 tablet by mouth 2 (two) times daily.   Past Week   diazepam (VALIUM) 5 MG tablet Take 5 mg by mouth.   Past Month   losartan (COZAAR) 100 MG tablet Take 100 mg by mouth daily.   06/01/2021   Magnesium 500 MG TABS Take by mouth. '250mg'$  2x daily   Past Month   metoprolol succinate (TOPROL-XL) 50 MG 24 hr tablet Take 50 mg by mouth daily. Take with or immediately following a meal.   06/01/2021   rosuvastatin (CRESTOR) 20 MG tablet Take 20 mg by mouth daily.   06/01/2021   TURMERIC PO Take by mouth. 600 mg q am + '1200mg'$  qpm   Past Month   vitamin E 1000 UNIT capsule Take 1,000 Units by mouth 2 (two) times daily.   Past Month   acetaminophen-codeine (TYLENOL #3) 300-30 MG tablet Take 1 tablet by mouth every 6 (six) hours as needed.      cephALEXin (KEFLEX) 500 MG capsule Take 500 mg by mouth every 6 (six) hours. (Patient not taking: Reported on 05/06/2020)      clindamycin (CLEOCIN)  150 MG capsule Take 150 mg by mouth 4 (four) times daily. (Patient not taking: Reported on 05/06/2020)      lisinopril (PRINIVIL,ZESTRIL) 10 MG tablet Take 10 mg by mouth daily. (Patient not taking: Reported on 04/27/2021)      metroNIDAZOLE (METROCREAM) 0.75 % cream Apply topically 2 (two) times daily.      ondansetron (ZOFRAN-ODT) 4 MG disintegrating tablet Take 4 mg by mouth every 8 (eight) hours as needed.        No Known Allergies   Past Medical History:  Diagnosis Date   Basal cell carcinoma    face, back txted in past by Dr. Loyal Jacobson   Hx of basal cell carcinoma 10/26/2010   R temple at hairline   Hyperlipidemia    Hypertension    Mitral valve prolapse     Review of systems:  Otherwise negative.    Physical Exam  Gen: Alert, oriented. Appears stated age.  HEENT: PERRLA. Lungs: No respiratory distress CV: RRR Abd: soft, benign, no masses Ext: No edema    Planned procedures: Proceed with colonoscopy. The patient understands the nature of the planned procedure, indications, risks, alternatives and potential complications including but not limited to bleeding, infection, perforation, damage to internal organs and possible oversedation/side effects from anesthesia. The patient  agrees and gives consent to proceed.  Please refer to procedure notes for findings, recommendations and patient disposition/instructions.     Juan Miyamoto MD, MPH Gastroenterology 06/02/2021  8:22 AM

## 2021-06-05 LAB — SURGICAL PATHOLOGY

## 2021-10-18 ENCOUNTER — Inpatient Hospital Stay
Admission: EM | Admit: 2021-10-18 | Discharge: 2021-10-20 | DRG: 254 | Disposition: A | Discharge status: Home / Self Care | Payer: Medicare Other | Attending: Family Medicine | Admitting: Family Medicine

## 2021-10-18 ENCOUNTER — Encounter
Admission: EM | Disposition: A | Discharge status: Home / Self Care | Payer: Self-pay | Source: Home / Self Care | Attending: Family Medicine

## 2021-10-18 ENCOUNTER — Encounter: Payer: Self-pay | Admitting: Emergency Medicine

## 2021-10-18 ENCOUNTER — Other Ambulatory Visit: Payer: Self-pay

## 2021-10-18 DIAGNOSIS — I5020 Unspecified systolic (congestive) heart failure: Secondary | ICD-10-CM | POA: Diagnosis present

## 2021-10-18 DIAGNOSIS — F419 Anxiety disorder, unspecified: Secondary | ICD-10-CM | POA: Diagnosis present

## 2021-10-18 DIAGNOSIS — I749 Embolism and thrombosis of unspecified artery: Secondary | ICD-10-CM

## 2021-10-18 DIAGNOSIS — Z85828 Personal history of other malignant neoplasm of skin: Secondary | ICD-10-CM | POA: Diagnosis not present

## 2021-10-18 DIAGNOSIS — I998 Other disorder of circulatory system: Secondary | ICD-10-CM | POA: Diagnosis present

## 2021-10-18 DIAGNOSIS — Z20822 Contact with and (suspected) exposure to covid-19: Secondary | ICD-10-CM | POA: Diagnosis present

## 2021-10-18 DIAGNOSIS — F418 Other specified anxiety disorders: Secondary | ICD-10-CM | POA: Diagnosis present

## 2021-10-18 DIAGNOSIS — R911 Solitary pulmonary nodule: Secondary | ICD-10-CM | POA: Diagnosis present

## 2021-10-18 DIAGNOSIS — I10 Essential (primary) hypertension: Secondary | ICD-10-CM | POA: Diagnosis not present

## 2021-10-18 DIAGNOSIS — Z8249 Family history of ischemic heart disease and other diseases of the circulatory system: Secondary | ICD-10-CM

## 2021-10-18 DIAGNOSIS — I110 Hypertensive heart disease with heart failure: Secondary | ICD-10-CM | POA: Diagnosis present

## 2021-10-18 DIAGNOSIS — E785 Hyperlipidemia, unspecified: Secondary | ICD-10-CM | POA: Diagnosis present

## 2021-10-18 DIAGNOSIS — Z79899 Other long term (current) drug therapy: Secondary | ICD-10-CM

## 2021-10-18 DIAGNOSIS — D696 Thrombocytopenia, unspecified: Secondary | ICD-10-CM | POA: Diagnosis present

## 2021-10-18 DIAGNOSIS — I081 Rheumatic disorders of both mitral and tricuspid valves: Secondary | ICD-10-CM | POA: Diagnosis present

## 2021-10-18 DIAGNOSIS — I77810 Thoracic aortic ectasia: Secondary | ICD-10-CM | POA: Diagnosis present

## 2021-10-18 DIAGNOSIS — I361 Nonrheumatic tricuspid (valve) insufficiency: Secondary | ICD-10-CM | POA: Diagnosis not present

## 2021-10-18 DIAGNOSIS — F32A Depression, unspecified: Secondary | ICD-10-CM | POA: Diagnosis present

## 2021-10-18 DIAGNOSIS — I340 Nonrheumatic mitral (valve) insufficiency: Secondary | ICD-10-CM | POA: Diagnosis not present

## 2021-10-18 HISTORY — DX: Anxiety disorder, unspecified: F41.9

## 2021-10-18 HISTORY — PX: UPPER EXTREMITY ANGIOGRAPHY: CATH118270

## 2021-10-18 HISTORY — DX: Depression, unspecified: F32.A

## 2021-10-18 LAB — CBC WITH DIFFERENTIAL/PLATELET
Abs Immature Granulocytes: 0.02 10*3/uL (ref 0.00–0.07)
Basophils Absolute: 0 10*3/uL (ref 0.0–0.1)
Basophils Relative: 1 %
Eosinophils Absolute: 0.1 10*3/uL (ref 0.0–0.5)
Eosinophils Relative: 3 %
HCT: 48.5 % (ref 39.0–52.0)
Hemoglobin: 16.6 g/dL (ref 13.0–17.0)
Immature Granulocytes: 1 %
Lymphocytes Relative: 37 %
Lymphs Abs: 1.3 10*3/uL (ref 0.7–4.0)
MCH: 31.6 pg (ref 26.0–34.0)
MCHC: 34.2 g/dL (ref 30.0–36.0)
MCV: 92.4 fL (ref 80.0–100.0)
Monocytes Absolute: 0.3 10*3/uL (ref 0.1–1.0)
Monocytes Relative: 10 %
Neutro Abs: 1.8 10*3/uL (ref 1.7–7.7)
Neutrophils Relative %: 48 %
Platelets: 130 10*3/uL — ABNORMAL LOW (ref 150–400)
RBC: 5.25 MIL/uL (ref 4.22–5.81)
RDW: 12.7 % (ref 11.5–15.5)
WBC: 3.6 10*3/uL — ABNORMAL LOW (ref 4.0–10.5)
nRBC: 0 % (ref 0.0–0.2)

## 2021-10-18 LAB — HIV ANTIBODY (ROUTINE TESTING W REFLEX): HIV Screen 4th Generation wRfx: NONREACTIVE

## 2021-10-18 LAB — COMPREHENSIVE METABOLIC PANEL
ALT: 57 U/L — ABNORMAL HIGH (ref 0–44)
AST: 40 U/L (ref 15–41)
Albumin: 4 g/dL (ref 3.5–5.0)
Alkaline Phosphatase: 82 U/L (ref 38–126)
Anion gap: 8 (ref 5–15)
BUN: 13 mg/dL (ref 8–23)
CO2: 25 mmol/L (ref 22–32)
Calcium: 8.7 mg/dL — ABNORMAL LOW (ref 8.9–10.3)
Chloride: 107 mmol/L (ref 98–111)
Creatinine, Ser: 1.1 mg/dL (ref 0.61–1.24)
GFR, Estimated: >60 mL/min (ref >60)
Glucose, Bld: 209 mg/dL — ABNORMAL HIGH (ref 70–99)
Potassium: 3.8 mmol/L (ref 3.5–5.1)
Sodium: 140 mmol/L (ref 135–145)
Total Bilirubin: 1 mg/dL (ref 0.3–1.2)
Total Protein: 6.9 g/dL (ref 6.5–8.1)

## 2021-10-18 LAB — PROTIME-INR
INR: 1 (ref 0.8–1.2)
Prothrombin Time: 12.7 seconds (ref 11.4–15.2)

## 2021-10-18 LAB — HEMOGLOBIN A1C
Hgb A1c MFr Bld: 7.3 % — ABNORMAL HIGH (ref 4.8–5.6)
Mean Plasma Glucose: 163 mg/dL

## 2021-10-18 LAB — APTT: aPTT: 27 seconds (ref 24–36)

## 2021-10-18 LAB — HEPARIN LEVEL (UNFRACTIONATED): Heparin Unfractionated: 0.3 IU/mL (ref 0.30–0.70)

## 2021-10-18 LAB — RESP PANEL BY RT-PCR (FLU A&B, COVID) ARPGX2
Influenza A by PCR: NEGATIVE
Influenza B by PCR: NEGATIVE
SARS Coronavirus 2 by RT PCR: NEGATIVE

## 2021-10-18 SURGERY — UPPER EXTREMITY ANGIOGRAPHY
Anesthesia: Moderate Sedation | Laterality: Right

## 2021-10-18 MED ORDER — OXYCODONE-ACETAMINOPHEN 5-325 MG PO TABS
1.0000 | ORAL_TABLET | ORAL | Status: DC | PRN
Start: 2021-10-18 — End: 2021-10-20
  Administered 2021-10-18 – 2021-10-20 (×7): 1 via ORAL
  Filled 2021-10-18 (×7): qty 1

## 2021-10-18 MED ORDER — NITROGLYCERIN 1 MG/10 ML FOR IR/CATH LAB
INTRA_ARTERIAL | Status: AC
  Filled 2021-10-18: qty 10

## 2021-10-18 MED ORDER — SODIUM CHLORIDE 0.9 % IV SOLN: INTRAVENOUS | Status: DC

## 2021-10-18 MED ORDER — LOSARTAN POTASSIUM 50 MG PO TABS
100.0000 mg | ORAL_TABLET | Freq: Every day | ORAL | Status: DC
  Filled 2021-10-18: qty 2

## 2021-10-18 MED ORDER — NITROGLYCERIN 1 MG/10 ML FOR IR/CATH LAB
INTRA_ARTERIAL | Status: DC | PRN
  Administered 2021-10-18: 200 ug via INTRA_ARTERIAL
  Administered 2021-10-18: 300 ug via INTRA_ARTERIAL

## 2021-10-18 MED ORDER — HYDROMORPHONE HCL 1 MG/ML IJ SOLN
0.5000 mg | Freq: Once | INTRAMUSCULAR | Status: AC
  Administered 2021-10-18: 08:00:00 0.5 mg via INTRAVENOUS
  Filled 2021-10-18: qty 1

## 2021-10-18 MED ORDER — HEPARIN SODIUM (PORCINE) 1000 UNIT/ML IJ SOLN
INTRAMUSCULAR | Status: AC
  Filled 2021-10-18: qty 10

## 2021-10-18 MED ORDER — LABETALOL HCL 5 MG/ML IV SOLN
INTRAVENOUS | Status: DC | PRN
  Administered 2021-10-18: 10 mg via INTRAVENOUS

## 2021-10-18 MED ORDER — MIDAZOLAM HCL 2 MG/2ML IJ SOLN
INTRAMUSCULAR | Status: DC | PRN
  Administered 2021-10-18: 1 mg via INTRAVENOUS
  Administered 2021-10-18: 2 mg via INTRAVENOUS
  Administered 2021-10-18 (×3): 1 mg via INTRAVENOUS

## 2021-10-18 MED ORDER — MIDAZOLAM HCL 2 MG/2ML IJ SOLN
INTRAMUSCULAR | Status: AC
  Filled 2021-10-18: qty 2

## 2021-10-18 MED ORDER — HEPARIN BOLUS VIA INFUSION
5000.0000 [IU] | Freq: Once | INTRAVENOUS | Status: AC
  Administered 2021-10-18: 10:00:00 5000 [IU] via INTRAVENOUS
  Filled 2021-10-18: qty 5000

## 2021-10-18 MED ORDER — LOSARTAN POTASSIUM 50 MG PO TABS
100.0000 mg | ORAL_TABLET | Freq: Every day | ORAL | Status: DC
  Administered 2021-10-18: 23:00:00 100 mg via ORAL
  Filled 2021-10-18: qty 2

## 2021-10-18 MED ORDER — FENTANYL CITRATE (PF) 100 MCG/2ML IJ SOLN
INTRAMUSCULAR | Status: AC
  Filled 2021-10-18: qty 2

## 2021-10-18 MED ORDER — HEPARIN (PORCINE) 25000 UT/250ML-% IV SOLN
1550.0000 [IU]/h | INTRAVENOUS | Status: DC
Start: 2021-10-18 — End: 2021-10-19
  Administered 2021-10-18 – 2021-10-19 (×2): 1550 [IU]/h via INTRAVENOUS
  Filled 2021-10-18 (×2): qty 250

## 2021-10-18 MED ORDER — CARVEDILOL 3.125 MG PO TABS
6.2500 mg | ORAL_TABLET | Freq: Two times a day (BID) | ORAL | Status: DC
  Administered 2021-10-18 – 2021-10-19 (×4): 6.25 mg via ORAL
  Filled 2021-10-18 (×5): qty 2

## 2021-10-18 MED ORDER — ONDANSETRON HCL 4 MG/2ML IJ SOLN
4.0000 mg | Freq: Three times a day (TID) | INTRAMUSCULAR | Status: DC | PRN
  Administered 2021-10-19: 4 mg via INTRAVENOUS
  Filled 2021-10-18: qty 2

## 2021-10-18 MED ORDER — CEFAZOLIN SODIUM-DEXTROSE 2-4 GM/100ML-% IV SOLN: 2.0000 g | INTRAVENOUS | Status: DC

## 2021-10-18 MED ORDER — FENTANYL CITRATE (PF) 100 MCG/2ML IJ SOLN
INTRAMUSCULAR | Status: DC | PRN
  Administered 2021-10-18 (×3): 50 ug via INTRAVENOUS

## 2021-10-18 MED ORDER — ACETAMINOPHEN 325 MG PO TABS: 650.0000 mg | ORAL_TABLET | Freq: Four times a day (QID) | ORAL | Status: DC | PRN

## 2021-10-18 MED ORDER — HEPARIN SODIUM (PORCINE) 1000 UNIT/ML IJ SOLN
INTRAMUSCULAR | Status: DC | PRN
  Administered 2021-10-18: 3000 [IU] via INTRAVENOUS
  Administered 2021-10-18: 2000 [IU] via INTRAVENOUS

## 2021-10-18 MED ORDER — ALPRAZOLAM 0.25 MG PO TABS
0.2500 mg | ORAL_TABLET | Freq: Three times a day (TID) | ORAL | Status: DC | PRN
  Administered 2021-10-18 – 2021-10-19 (×4): 0.25 mg via ORAL
  Filled 2021-10-18 (×4): qty 1

## 2021-10-18 MED ORDER — ROSUVASTATIN CALCIUM 10 MG PO TABS
20.0000 mg | ORAL_TABLET | Freq: Every day | ORAL | Status: DC
  Administered 2021-10-19 – 2021-10-20 (×2): 20 mg via ORAL
  Filled 2021-10-18 (×3): qty 2
  Filled 2021-10-18: qty 1

## 2021-10-18 MED ORDER — TRAZODONE HCL 50 MG PO TABS
25.0000 mg | ORAL_TABLET | Freq: Every day | ORAL | Status: DC
  Filled 2021-10-18: qty 1

## 2021-10-18 MED ORDER — HYDRALAZINE HCL 20 MG/ML IJ SOLN: 5.0000 mg | INTRAMUSCULAR | Status: DC | PRN

## 2021-10-18 MED ORDER — MIDAZOLAM HCL 2 MG/2ML IJ SOLN
INTRAMUSCULAR | Status: AC
  Filled 2021-10-18: qty 4

## 2021-10-18 MED ORDER — CEFAZOLIN SODIUM-DEXTROSE 1-4 GM/50ML-% IV SOLN
INTRAVENOUS | Status: DC | PRN
  Administered 2021-10-18: 2 g via INTRAVENOUS

## 2021-10-18 MED ORDER — LABETALOL HCL 5 MG/ML IV SOLN
INTRAVENOUS | Status: AC
  Filled 2021-10-18: qty 4

## 2021-10-18 MED ORDER — MORPHINE SULFATE (PF) 2 MG/ML IV SOLN
2.0000 mg | INTRAVENOUS | Status: DC | PRN
  Administered 2021-10-18 – 2021-10-20 (×5): 2 mg via INTRAVENOUS
  Filled 2021-10-18 (×6): qty 1

## 2021-10-18 MED ORDER — IODIXANOL 320 MG/ML IV SOLN
INTRAVENOUS | Status: DC | PRN
  Administered 2021-10-18: 12:00:00 60 mL

## 2021-10-18 MED ORDER — CEFAZOLIN SODIUM-DEXTROSE 2-4 GM/100ML-% IV SOLN
INTRAVENOUS | Status: AC
  Filled 2021-10-18: qty 100

## 2021-10-18 MED ORDER — FENTANYL CITRATE PF 50 MCG/ML IJ SOSY
PREFILLED_SYRINGE | INTRAMUSCULAR | Status: AC
  Filled 2021-10-18: qty 1

## 2021-10-18 SURGICAL SUPPLY — 20 items
BALLN ULTRVRSE RX 2X60X200 (BALLOONS) ×2
BALLOON ULTRVRSE RX 2X60X200 (BALLOONS) IMPLANT
CANISTER PENUMBRA ENGINE (MISCELLANEOUS) ×1 IMPLANT
CATH ANGIO 5F PIGTAIL 100CM (CATHETERS) ×1 IMPLANT
CATH INDIGO CAT3 KIT (CATHETERS) ×1 IMPLANT
CATH NAVICROSS ANGLED 135CM (MICROCATHETER) ×1 IMPLANT
COVER DRAPE FLUORO 36X44 (DRAPES) ×1 IMPLANT
COVER PROBE U/S 5X48 (MISCELLANEOUS) ×1 IMPLANT
DEVICE STARCLOSE SE CLOSURE (Vascular Products) ×1 IMPLANT
DEVICE TORQUE (MISCELLANEOUS) ×1 IMPLANT
GLIDEWIRE ADV .014X300CM (WIRE) ×1 IMPLANT
GLIDEWIRE ANGLED SS 035X260CM (WIRE) ×1 IMPLANT
KIT ENCORE 26 ADVANTAGE (KITS) ×1 IMPLANT
SHEATH GUIDING CAROTID 6FRX90 (SHEATH) ×1 IMPLANT
SHEATH PINNACLE 5F 10CM (SHEATH) ×1 IMPLANT
SYR MEDRAD MARK 7 150ML (SYRINGE) ×1 IMPLANT
TUBING CONTRAST HIGH PRESS 72 (TUBING) ×1 IMPLANT
Ultraverse RX 2mm x 60mm x 200cm ×1 IMPLANT
WIRE GUIDERIGHT .035X150 (WIRE) ×1 IMPLANT
WIRE MAGIC TORQUE 260C (WIRE) ×1 IMPLANT

## 2021-10-18 NOTE — Progress Notes (Signed)
ANTICOAGULATION CONSULT NOTE - Initial Consult  Pharmacy Consult for Heparin consult Indication:  ischemic finger  No Known Allergies  Patient Measurements: Height: 5\' 10"  (177.8 cm) Weight: 104.3 kg (230 lb) IBW/kg (Calculated) : 73 Heparin Dosing Weight: 95 kg  Vital Signs: Temp: 97.8 F (36.6 C) (12/14 1518) Temp Source: Oral (12/14 1035) BP: 133/60 (12/14 1518) Pulse Rate: 86 (12/14 1518)  Labs: Recent Labs    10/18/21 0740 10/18/21 1922  HGB 16.6  --   HCT 48.5  --   PLT 130*  --   APTT 27  --   LABPROT 12.7  --   INR 1.0  --   HEPARINUNFRC  --  0.30  CREATININE 1.10  --      Estimated Creatinine Clearance: 78.8 mL/min (by C-G formula based on SCr of 1.1 mg/dL).   Medical History: Past Medical History:  Diagnosis Date   Anxiety    Basal cell carcinoma    face, back txted in past by Dr. Loyal Jacobson   Depression    Hx of basal cell carcinoma 10/26/2010   R temple at hairline   Hyperlipidemia    Hypertension    Mitral valve prolapse     Medications:  Scheduled:   carvedilol  6.25 mg Oral BID   fentaNYL       fentaNYL       heparin sodium (porcine)       labetalol       losartan  100 mg Oral Daily   midazolam       midazolam       rosuvastatin  20 mg Oral Daily   Infusions:   sodium chloride 75 mL/hr at 10/18/21 0910   ceFAZolin     heparin 1,550 Units/hr (10/18/21 1303)    Assessment: 65 yo male w/ possible ischemic finger to start Heparin drip. No anticoagulants PTA per Med Rec Hgb 16.6  Plt 130   aPTT 27  INR 1.0 Angiography 12/14  12/14 1922 HL = 0.30 @ 1550 units/hr    Goal of Therapy:  Heparin level 0.3-0.7 units/ml Monitor platelets by anticoagulation protocol: Yes   Plan:  Heparin therapeutic  Continue heparin infusion at 1550 units/hr Check confirmatory anti-Xa level in 6 hours  Continue to monitor H&H and platelets  Dorothe Pea, PharmD, BCPS Clinical Pharmacist   10/18/2021,8:13 PM
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
## 2021-10-18 NOTE — Plan of Care (Signed)
Pt received from special recovery alert and oriented. Complaining of pain right 4th finger. Dsg to right femoral artery dry and intact.,no hematoma at site.  Heparin infusing at 15.5 units/hr. Wife and daughter at bedside. Medicated for pain. Pt will continue to be monitored by nsg staff. Phone and call bell within reach.

## 2021-10-18 NOTE — Op Note (Signed)
OPERATIVE REPORT     PREOPERATIVE DIAGNOSIS: Ischemic right fourth finger   POSTOPERATIVE DIAGNOSIS: Same as above   PROCEDURE PERFORMED: 1. Ultrasound guidance vascular access to right femoral artery. 2. Catheter placement to right radial artery and right ulnar arteries     from right femoral approach. 3. Thoracic aortogram and selective right upper extremity angiogram     including selective images of the radial and ulnar arteries. 4.  Mechanical thrombectomy to the right ulnar artery with the penumbra CAT 3 device 5.  Percutaneous transluminal angioplasty of the right distal ulnar artery with 2 mm diameter by 6 cm length angioplasty balloon 6. StarClose closure device right femoral artery.   SURGEON:  Algernon Huxley, MD   ANESTHESIA:  Local with moderate conscious sedation for 58 minutes using 6 mg of Versed and 150 mcg of Fentanyl   BLOOD LOSS:  Minimal.   FLUOROSCOPY TIME:  7.1 min   INDICATION FOR PROCEDURE:  This is a 65 y.o.male who presented to our hospital with acute ischemia of the right fourth finger over the past 24 to 48 hours.  To further evaluate this to determine what options would be possible to treat the ischemia syndrome, angiogram of the left upper extremity is indicated.  Risks and benefits are discussed.  Informed consent was obtained.   DESCRIPTION OF PROCEDURE:  The patient was brought to the vascular suite.  Moderate conscious sedation was administered during a face to face encounter with the patient throughout the procedure with my supervision of the RN administering medicines and monitoring the patient's vital signs, pulse oximetry, telemetry and mental status throughout from the start of the procedure until the patient was taken to the recovery room.  Groins were shaved and prepped and sterile surgical field was created.  The right femoral head was localized with fluoroscopy and the right femoral artery was then visualized with ultrasound and found to  be widely patent.  It was then accessed under direct ultrasound guidance without difficulty with a Seldinger needle and a permanent image was recorded.  A J-wire and 5-French sheath were then placed.  Pigtail catheter was placed into the ascending aorta and a thoracic aortogram was then performed in the LAO projection. This demonstrated normal origins to the great vessels without significant proximal stenoses and a bovine configuration of the great vessels.  The patient was given 3000 units of intravenous heparin and a pigtail catheter was used to selectively cannulate the right subclavian artery without difficulty.  This was then sequentially advanced to the brachial artery and to the brachial bifurcation.  The right subclavian artery, axillary artery, and brachial arteries were all widely patent.  Then exchanged for a Ferd Hibbs cross catheter and a Glidewire and advanced into the right radial artery where selective imaging was performed.  This demonstrated a patent right radial artery which was large and did not have any focal stenosis.  It filled the hand primarily although the third finger was poorly filled and the fourth finger was not filled through this injection.  I then pulled the [facility] cross catheter back to the distal brachial artery and selectively cannulated the right ulnar artery where selective imaging was performed.  The proximal and mid right ulnar arteries were widely patent.  There was an abrupt occlusion of the distal ulnar artery a few centimeters proximal to the wrist with very poor filling of the hand.  This appeared to be an abrupt occlusion and was likely embolic given the acute nature of his  symptoms.  I decided to proceed with intervention to this ulnar occlusion.  Over a Magic torque wire, exchanged for a 6 French 90 cm destination sheath.  I then exchanged for a 0.014 advantage wire was able to get into the palmar arch through the ulnar occlusion.  2 passes were made with the penumbra  CAT 3 device and for mechanical thrombectomy of the distal ulnar artery to the wrist.  The catheter was hubbed I did not get across the occlusion into the hand.  Following this, the flow remained extremely sluggish although there was likely a component of vasospasm.  We did treat with intra-arterial nitroglycerin and then proceeded with balloon angioplasty.  A 2 mm diameter by 6 cm length angioplasty balloon was inflated twice in the hand and then across the wrist and distal ulnar artery.  Each inflation was 8 to 10 atm for 1 minute.  Completion imaging did show improvement now with a channel flow through the distal ulnar artery although he did still remain significantly stenotic.  There now was at least slight filling of the fourth digit on angiogram.  At this point, there is really not much further we can do from an endovascular standpoint, and if his finger remains ischemic, he will need to be referred to a hand surgeon for consideration for intrinsic hand bypass. The diagnostic catheter was removed.  Oblique arteriogram was performed of the right femoral artery and StarClose closure device deployed in the usual fashion with excellent hemostatic result. The patient tolerated the procedure well and was taken to the recovery room in stable condition.    Leotis Pain 10/18/2021 12:29 PM

## 2021-10-18 NOTE — Progress Notes (Signed)
ANTICOAGULATION CONSULT NOTE - Initial Consult  Pharmacy Consult for Heparin consult Indication:  ischemic finger  No Known Allergies  Patient Measurements: Height: 5\' 10"  (177.8 cm) Weight: 104.3 kg (230 lb) IBW/kg (Calculated) : 73 Heparin Dosing Weight: 95 kg  Vital Signs: Temp: 98.5 F (36.9 C) (12/14 0613) Temp Source: Oral (12/14 4920) BP: 141/82 (12/14 0800) Pulse Rate: 78 (12/14 0800)  Labs: Recent Labs    10/18/21 0740  HGB 16.6  HCT 48.5  PLT 130*  APTT 27  LABPROT 12.7  INR 1.0  CREATININE 1.10    Estimated Creatinine Clearance: 78.8 mL/min (by C-G formula based on SCr of 1.1 mg/dL).   Medical History: Past Medical History:  Diagnosis Date   Anxiety    Basal cell carcinoma    face, back txted in past by Dr. Loyal Jacobson   Depression    Hx of basal cell carcinoma 10/26/2010   R temple at hairline   Hyperlipidemia    Hypertension    Mitral valve prolapse     Medications:  Scheduled:   carvedilol  6.25 mg Oral BID   heparin  5,000 Units Intravenous Once   losartan  100 mg Oral Daily   rosuvastatin  20 mg Oral Daily   Infusions:   sodium chloride     heparin      Assessment: 65 yo male w/ possible ischemic finger to start Heparin drip. No anticoagulants PTA per Med Rec Hgb 16.6  Plt 130   aPTT 27  INR 1.0 Angiography today? 12/14    Goal of Therapy:  Heparin level 0.3-0.7 units/ml Monitor platelets by anticoagulation protocol: Yes   Plan:  Give 5000 units bolus x 1 Start heparin infusion at 1550 units/hr Check anti-Xa level in 6 hours and daily while on heparin Continue to monitor H&H and platelets  Josephmichael Lisenbee A 10/18/2021,8:31 AM
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
## 2021-10-18 NOTE — ED Triage Notes (Signed)
Pt to ED from home c/o right 4th finger numbness starting around 0900 yesterday, pain woke patient up around 0500 today.  Denies injury.  States stuck finger in ice water around 0530 this morning to try to help with pain but no relief.  Patient finger is cold, pale, numb, difficult to assess cap refill.

## 2021-10-19 ENCOUNTER — Inpatient Hospital Stay: Payer: Medicare Other

## 2021-10-19 ENCOUNTER — Encounter: Payer: Self-pay | Admitting: Vascular Surgery

## 2021-10-19 DIAGNOSIS — D696 Thrombocytopenia, unspecified: Secondary | ICD-10-CM

## 2021-10-19 LAB — CBC
HCT: 42.5 % (ref 39.0–52.0)
Hemoglobin: 14.5 g/dL (ref 13.0–17.0)
MCH: 31.5 pg (ref 26.0–34.0)
MCHC: 34.1 g/dL (ref 30.0–36.0)
MCV: 92.4 fL (ref 80.0–100.0)
Platelets: 110 10*3/uL — ABNORMAL LOW (ref 150–400)
RBC: 4.6 MIL/uL (ref 4.22–5.81)
RDW: 12.7 % (ref 11.5–15.5)
WBC: 4.8 10*3/uL (ref 4.0–10.5)
nRBC: 0 % (ref 0.0–0.2)

## 2021-10-19 LAB — BASIC METABOLIC PANEL
Anion gap: 7 (ref 5–15)
BUN: 13 mg/dL (ref 8–23)
CO2: 27 mmol/L (ref 22–32)
Calcium: 8.4 mg/dL — ABNORMAL LOW (ref 8.9–10.3)
Chloride: 105 mmol/L (ref 98–111)
Creatinine, Ser: 0.95 mg/dL (ref 0.61–1.24)
GFR, Estimated: >60 mL/min (ref >60)
Glucose, Bld: 126 mg/dL — ABNORMAL HIGH (ref 70–99)
Potassium: 3.5 mmol/L (ref 3.5–5.1)
Sodium: 139 mmol/L (ref 135–145)

## 2021-10-19 LAB — GLUCOSE, CAPILLARY
Glucose-Capillary: 150 mg/dL — ABNORMAL HIGH (ref 70–99)
Glucose-Capillary: 190 mg/dL — ABNORMAL HIGH (ref 70–99)
Glucose-Capillary: 150 mg/dL — ABNORMAL HIGH (ref 70–99)
Glucose-Capillary: 144 mg/dL — ABNORMAL HIGH (ref 70–99)

## 2021-10-19 LAB — HEPARIN LEVEL (UNFRACTIONATED): Heparin Unfractionated: 0.41 IU/mL (ref 0.30–0.70)

## 2021-10-19 MED ORDER — IOHEXOL 350 MG/ML SOLN
80.0000 mL | Freq: Once | INTRAVENOUS | Status: AC | PRN
  Administered 2021-10-19: 80 mL via INTRAVENOUS

## 2021-10-19 MED ORDER — APIXABAN 5 MG PO TABS
10.0000 mg | ORAL_TABLET | Freq: Two times a day (BID) | ORAL | Status: DC
  Administered 2021-10-19 – 2021-10-20 (×2): 10 mg via ORAL
  Filled 2021-10-19 (×2): qty 2

## 2021-10-19 MED ORDER — GABAPENTIN 100 MG PO CAPS
100.0000 mg | ORAL_CAPSULE | Freq: Every day | ORAL | Status: DC
  Filled 2021-10-19: qty 1

## 2021-10-19 MED ORDER — APIXABAN 5 MG PO TABS
5.0000 mg | ORAL_TABLET | Freq: Two times a day (BID) | ORAL | Status: DC
  Administered 2021-10-19: 5 mg via ORAL
  Filled 2021-10-19: qty 1

## 2021-10-19 MED ORDER — APIXABAN 5 MG PO TABS: 5.0000 mg | ORAL_TABLET | Freq: Two times a day (BID) | ORAL | Status: DC

## 2021-10-19 NOTE — TOC Progression Note (Signed)
Transition of Care [facility]) - Progression Note    Patient Details  Name: Juan Olson MRN: [id number] Date of Birth: 11-06-1955  Transition of Care [facility]) CM/SW [place], RN Phone Number: 10/19/2021, 9:25 AM  Clinical Narrative:     Transition of Care [facility]) Screening Note   Patient Details  Name: Juan Olson Date of Birth: 1955/12/22   Transition of Care [facility]) CM/SW Contact:    Conception Oms, RN Phone Number: 10/19/2021, 9:25 AM    Transition of Care Department [facility]) has reviewed patient and no TOC needs have been identified at this time. We will continue to monitor patient advancement through interdisciplinary progression rounds. If new patient transition needs arise, please place a TOC consult.          Expected Discharge Plan and Services                                                 Social Determinants of Health (SDOH) Interventions    Readmission Risk Interventions No flowsheet data found.

## 2021-10-19 NOTE — Assessment & Plan Note (Signed)
Xanax prn 

## 2021-10-19 NOTE — Progress Notes (Signed)
ANTICOAGULATION CONSULT NOTE   Pharmacy Consult for Heparin consult Indication:  ischemic finger  No Known Allergies  Patient Measurements: Height: 5\' 10"  (177.8 cm) Weight: 104.3 kg (230 lb) IBW/kg (Calculated) : 73 Heparin Dosing Weight: 95 kg  Vital Signs: Temp: 98.5 F (36.9 C) (12/15 0329) BP: 153/85 (12/15 0329) Pulse Rate: 90 (12/15 0329)  Labs: Recent Labs    10/18/21 0740 10/18/21 1922 10/19/21 0137  HGB 16.6  --  14.5  HCT 48.5  --  42.5  PLT 130*  --  110*  APTT 27  --   --   LABPROT 12.7  --   --   INR 1.0  --   --   HEPARINUNFRC  --  0.30 0.41  CREATININE 1.10  --   --      Estimated Creatinine Clearance: 78.8 mL/min (by C-G formula based on SCr of 1.1 mg/dL).   Medical History: Past Medical History:  Diagnosis Date   Anxiety    Basal cell carcinoma    face, back txted in past by Dr. Loyal Jacobson   Depression    Hx of basal cell carcinoma 10/26/2010   R temple at hairline   Hyperlipidemia    Hypertension    Mitral valve prolapse     Medications:  Scheduled:   carvedilol  6.25 mg Oral BID   losartan  100 mg Oral Daily   rosuvastatin  20 mg Oral Daily   traZODone  25 mg Oral QHS   Infusions:   sodium chloride 75 mL/hr at 10/18/21 0910   heparin 1,550 Units/hr (10/19/21 0217)    Assessment: 65 yo male w/ possible ischemic finger to start Heparin drip. No anticoagulants PTA per Med Rec Hgb 16.6  Plt 130   aPTT 27  INR 1.0 Angiography 12/14  12/14 1922 HL = 0.30 @ 1550 units/hr 12/15 0137 HL = 0.41  Goal of Therapy:  Heparin level 0.3-0.7 units/ml Monitor platelets by anticoagulation protocol: Yes   Plan:  Heparin therapeutic x 2 Continue heparin infusion at 1550 units/hr CBC and HL daily while on heparin  Renda Rolls, PharmD, [facility] 10/19/2021 4:57 AM

## 2021-10-19 NOTE — Assessment & Plan Note (Addendum)
Presented with pain and numbness to 4th finger, cool and pale, poor cap refill S/p vascular eval -> s/p thoracic aortogram and selective right upper extremity angiogram, mechanical thrombectomy to R ulnar artery, percutaneous transluminal angioplasty Normal origins to great vessels without sig proximal stenosis and bovine configuration of great vessels - right subclavian, axillary, and brachial arteries patent.  Patent right radial artery.  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   Completion imaging showed improvement, but remained significantly stenotic, slight filling of 4th digit on angiogram Will need referral to hand surgery for consideration of intrinsic hand bypass (this has been arranged by vascular) Will transition to eliquis per vascular CTA chest (without aortic dissection no ulcerated plaque - notable for ectasia of ascending thoracic aorta ectasia), LE [place] (without DVT), echo with doppler (notable for low EF -> no evidence of interatrial shunt - cardiology follow up arranged), continue tele monitoring (sinus with PAC's).  outpatient heme follow up for hypercoaguable w/u Needs outpatient hand follow up Cardiology follow up and ziopatch

## 2021-10-19 NOTE — Progress Notes (Signed)
PROGRESS NOTE    Juan Olson  XVQ:[id number] DOB: 03/11/1956 DOA: 10/18/2021 PCP: Cyndi Bender, PA-C  Chief Complaint  Patient presents with   Hand Pain    Brief Narrative:  65 yo with HTN, HLD, anxiety, MV prolapse, BCC who presented with right ischemic 4th finger, now s/p intervention with vascular.  Undergoing eval for embolic source at this time.  Will need hand surgery follow up.    Assessment & Plan:   Principal Problem:   Ischemic finger_right 4th finger Active Problems:   Thrombocytopenia (HCC)   Hypertension   HLD (hyperlipidemia)   Depression with anxiety   * Ischemic finger_right 4th finger Presented with pain and numbness to 4th finger, cool and pale, poor cap refill S/p vascular eval -> s/p thoracic aortogram and selective right upper extremity angiogram, mechanical thrombectomy to R ulnar artery, percutaneous transluminal angioplasty Normal origins to great vessels without sig proximal stenosis and bovine configuration of great vessels - right subclavian, axillary, and brachial arteries patent.  Patent right radial artery.  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   Completion imaging showed improvement, but remained significantly stenotic, slight filling of 4th digit on angiogram Will need referral to hand surgery for consideration of intrinsic hand bypass Will transition to eliquis per vascular CTA chest, LE [place], echo with doppler, continue tele monitoring.  Will plan for outpatient heme follow up for hypercoaguable w/u Needs outpatient hand follow up   Thrombocytopenia (HCC) Mild, present on admission (chronic, present 02/2021 in care everywhere) follow  HLD (hyperlipidemia) crestor  Hypertension Coreg, cozaar (hold with contrast)  Depression with anxiety Xanax prn  DVT prophylaxis: eliquis Code Status: full Family Communication: wife at bedside Disposition:   Status is: Inpatient  Remains inpatient appropriate because:  ongoing w.u       Consultants:  vascular  Procedures:  LE US IMPRESSION: 1. No evidence of deep venous thrombosis within either lower extremity.   PROCEDURE PERFORMED: 1. Ultrasound guidance vascular access to right femoral artery. 2. Catheter placement to right radial artery and right ulnar arteries     from right femoral approach. 3. Thoracic aortogram and selective right upper extremity angiogram     including selective images of the radial and ulnar arteries. 4.  Mechanical thrombectomy to the right ulnar artery with the penumbra CAT 3 device 5.  Percutaneous transluminal angioplasty of the right distal ulnar artery with 2 mm diameter by 6 cm length angioplasty balloon 6. StarClose closure device right femoral artery.  Antimicrobials:  Anti-infectives (From admission, onward)    Start     Dose/Rate Route Frequency Ordered Stop   10/18/21 1108  ceFAZolin (ANCEF) IVPB 1 g/50 mL premix  Status:  Discontinued        over 30 Minutes  Continuous PRN 10/18/21 1108 10/18/21 1259   10/18/21 1006  ceFAZolin (ANCEF) 2-4 GM/100ML-% IVPB       Note to Pharmacy: Corlis Hove H: cabinet override      10/18/21 1006 10/18/21 2214   10/18/21 0902  ceFAZolin (ANCEF) IVPB 2g/100 mL premix  Status:  Discontinued        2 g 200 mL/hr over 30 Minutes Intravenous 30 min pre-op 10/18/21 0902 10/18/21 1259       Subjective: C/o continued numbness, intermittent pain  Objective: Vitals:   10/18/21 2332 10/19/21 0329 10/19/21 0717 10/19/21 1114  BP: 118/63 (!) 153/85 (!) 155/113 101/81  Pulse:  90 92 71  Resp: 15 15 16 16   Temp: 98.5  F (36.9 C) 98.5 F (36.9 C) 98.1 F (36.7 C) 98.2 F (36.8 C)  TempSrc:      SpO2: 94% 97% 96% 96%  Weight:      Height:        Intake/Output Summary (Last 24 hours) at 10/19/2021 1742 Last data filed at 10/19/2021 1409 Gross per 24 hour  Intake 317.41 ml  Output --  Net 317.41 ml   Filed Weights   10/18/21 0617  Weight: 104.3 kg     Examination:  General exam: Appears calm and comfortable  Respiratory system: Clear to auscultation. Respiratory effort normal. Cardiovascular system: RRR Gastrointestinal system: Abdomen is nondistended, soft and nontender.  Central nervous system: Alert and oriented. No focal neurological deficits. Extremities: R ring finger dusky, slow cap refill - palpable radial and ulnar pulses Skin: No rashes, lesions or ulcers Psychiatry: Judgement and insight appear normal. Mood & affect appropriate.     Data Reviewed: I have personally reviewed following labs and imaging studies  CBC: Recent Labs  Lab 10/18/21 0740 10/19/21 0137  WBC 3.6* 4.8  NEUTROABS 1.8  --   HGB 16.6 14.5  HCT 48.5 42.5  MCV 92.4 92.4  PLT 130* 110*    Basic Metabolic Panel: Recent Labs  Lab 10/18/21 0740 10/19/21 0137  NA 140 139  K 3.8 3.5  CL 107 105  CO2 25 27  GLUCOSE 209* 126*  BUN 13 13  CREATININE 1.10 0.95  CALCIUM 8.7* 8.4*    GFR: Estimated Creatinine Clearance: 91.3 mL/min (by C-G formula based on SCr of 0.95 mg/dL).  Liver Function Tests: Recent Labs  Lab 10/18/21 0740  AST 40  ALT 57*  ALKPHOS 82  BILITOT 1.0  PROT 6.9  ALBUMIN 4.0    CBG: Recent Labs  Lab 10/19/21 0722 10/19/21 1117 10/19/21 1529  GLUCAP 150* 190* 150*     Recent Results (from the past 240 hour(s))  Resp Panel by RT-PCR (Flu Tarika Mckethan&B, Covid) Nasopharyngeal Swab     Status: None   Collection Time: 10/18/21  7:40 AM   Specimen: Nasopharyngeal Swab; Nasopharyngeal(NP) swabs in vial transport medium  Result Value Ref Range Status   SARS Coronavirus 2 by RT PCR NEGATIVE NEGATIVE Final    Comment: (NOTE) SARS-CoV-2 target nucleic acids are NOT DETECTED.  The SARS-CoV-2 RNA is generally detectable in upper respiratory specimens during the acute phase of infection. The lowest concentration of SARS-CoV-2 viral copies this assay can detect is 138 copies/mL. Jencarlos Nicolson negative result does not preclude  SARS-Cov-2 infection and should not be used as the sole basis for treatment or other patient management decisions. Miyah Hampshire negative result may occur with  improper specimen collection/handling, submission of specimen other than nasopharyngeal swab, presence of viral mutation(s) within the areas targeted by this assay, and inadequate number of viral copies(<138 copies/mL). Beckett Hickmon negative result must be combined with clinical observations, patient history, and epidemiological information. The expected result is Negative.  Fact Sheet for Patients:  EntrepreneurPulse.com.au  Fact Sheet for Healthcare Providers:  IncredibleEmployment.be  This test is no t yet approved or cleared by the [place] FDA and  has been authorized for detection and/or diagnosis of SARS-CoV-2 by FDA under an Emergency Use Authorization (EUA). This EUA will remain  in effect (meaning this test can be used) for the duration of the COVID-19 declaration under Section 564(b)(1) of the Act, 21 U.S.C.section 360bbb-3(b)(1), unless the authorization is terminated  or revoked sooner.       Influenza Karley Pho by PCR  NEGATIVE NEGATIVE Final   Influenza B by PCR NEGATIVE NEGATIVE Final    Comment: (NOTE) The Xpert Xpress SARS-CoV-2/FLU/RSV plus assay is intended as an aid in the diagnosis of influenza from Nasopharyngeal swab specimens and should not be used as Wyoma Genson sole basis for treatment. Nasal washings and aspirates are unacceptable for Xpert Xpress SARS-CoV-2/FLU/RSV testing.  Fact Sheet for Patients: EntrepreneurPulse.com.au  Fact Sheet for Healthcare Providers: IncredibleEmployment.be  This test is not yet approved or cleared by the [place] FDA and has been authorized for detection and/or diagnosis of SARS-CoV-2 by FDA under an Emergency Use Authorization (EUA). This EUA will remain in effect (meaning this test can be used) for the duration of  the COVID-19 declaration under Section 564(b)(1) of the Act, 21 U.S.C. section 360bbb-3(b)(1), unless the authorization is terminated or revoked.  Performed at [facility], [address], [place], [place] [address]          Radiology Studies: PERIPHERAL VASCULAR CATHETERIZATION  Result Date: 10/18/2021 See surgical note for result.  US Venous Img Lower Bilateral (DVT)  Result Date: 10/19/2021 CLINICAL DATA:  Embolic infarction, right groin catheterization, anticoagulation therapy EXAM: BILATERAL LOWER EXTREMITY VENOUS DOPPLER ULTRASOUND TECHNIQUE: Gray-scale sonography with compression, as well as color and duplex ultrasound, were performed to evaluate the deep venous system(s) from the level of the common femoral vein through the popliteal and proximal calf veins. COMPARISON:  None. FINDINGS: VENOUS Normal compressibility of the common femoral, superficial femoral, and popliteal veins, as well as the visualized calf veins. Visualized portions of profunda femoral vein and great saphenous vein unremarkable. No filling defects to suggest DVT on grayscale or color Doppler imaging. Doppler waveforms show normal direction of venous flow, normal respiratory plasticity and response to augmentation. OTHER None. Limitations: none IMPRESSION: 1. No evidence of deep venous thrombosis within either lower extremity. Electronically Signed   By: Randa Ngo M.D.   On: 10/19/2021 16:40        Scheduled Meds:  apixaban  10 mg Oral BID   Followed by   Derrill Memo ON 10/26/2021] apixaban  5 mg Oral BID   carvedilol  6.25 mg Oral BID   losartan  100 mg Oral Daily   rosuvastatin  20 mg Oral Daily   traZODone  25 mg Oral QHS   Continuous Infusions:   LOS: 1 day    Time spent: over 30 min    Fayrene Helper, MD [facility]   To contact the attending provider between 7A-7P or the covering provider during after hours 7P-7A, please log into the web site [email or web address] and  access using universal [place] password for that web site. If you do not have the password, please call the hospital operator.  10/19/2021, 5:42 PM

## 2021-10-19 NOTE — Discharge Instructions (Signed)
WHAT DO YOU NEED TO KNOW ABOUT ELIQUIS? Take your Eliquis TWICE DAILY - one tablet in the morning and one tablet in the evening with or without food. It would be best to take the dose about the same time each day. If you have difficulty swallowing the tablet whole please discuss with your pharmacist how to take the medication safely.  Take Eliquis exactly as prescribed by your doctor and DO NOT stop taking Eliquis without talking to the doctor who prescribed the medication. Stopping without other medication to take the place of Eliquis may increase your risk of developing a clot.  After discharge, you should have regular check-up appointments with your healthcare provider that is prescribing your Eliquis.  WHAT DO YOU DO IF YOU MISS A DOSE? If a dose of ELIQUIS is not taken at the scheduled time, take it as soon as possible on the same day and twice-daily administration should be resumed. The dose should not be doubled to make up for a missed dose. Do not take more than one tablet of ELIQUIS at the same time.  IMPORTANT SAFETY INFORMATION A possible side effect of Eliquis is bleeding. You should call your healthcare provider right away if you experience any of the following: ? Bleeding from an injury or your nose that does not stop. ? Unusual colored urine (red or dark brown) or unusual colored stools (red or black). ? Unusual bruising for unknown reasons. ? A serious fall or if you hit your head (even if there is no bleeding).  Some medicines may interact with Eliquis and might increase your risk of bleeding or clotting while on Eliquis. To help avoid this, consult your healthcare provider or pharmacist prior to using any new prescription or non-prescription medications, including herbals, vitamins, non-steroidal anti-inflammatory drugs (NSAIDs) and supplements.  This website has more information on Eliquis (apixaban): [email or web address].

## 2021-10-19 NOTE — Assessment & Plan Note (Addendum)
Low 100's, present on admission(chronic, present 02/2021 in care everywhere) Follow, he'll follow up with heme as well

## 2021-10-19 NOTE — Hospital Course (Addendum)
65 yo with HTN, HLD, anxiety, MV prolapse, BCC who presented with right ischemic 4th finger, now s/p intervention with vascular.  Undergoing eval for embolic source at this time.  He's been in sinus on telemetry, echo notable for decreased EF.  Vascular has contacted Dr. Peggye Ley with hand, may need intrinsic hand bypass, Dr. Cherylann Parr office with follow up with Mr. Kau outpatient.  He's been started on eliquis and will continue this outpatient.  Cardiology is arranging follow up and he'll follow up with heme as well for hypercoagulable w/u.    See below for additional details

## 2021-10-19 NOTE — Assessment & Plan Note (Addendum)
Coreg (reduced dose with mild brady), cozaar

## 2021-10-19 NOTE — Progress Notes (Signed)
[facility] Daily Progress Note  10/18/21: 1. Ultrasound guidance vascular access to right femoral artery. 2. Catheter placement to right radial artery and right ulnar arteries     from right femoral approach. 3. Thoracic aortogram and selective right upper extremity angiogram     including selective images of the radial and ulnar arteries. 4.  Mechanical thrombectomy to the right ulnar artery with the penumbra CAT 3 device 5.  Percutaneous transluminal angioplasty of the right distal ulnar artery with 2 mm diameter by 6 cm length angioplasty balloon 6. StarClose closure device right femoral artery.  Subjective: Patient states intermittent discomfort to his right fourth finger.  No acute issues overnight.  Objective: Vitals:   10/18/21 2332 10/19/21 0329 10/19/21 0717 10/19/21 1114  BP: 118/63 (!) 153/85 (!) 155/113 101/81  Pulse:  90 92 71  Resp: 15 15 16 16   Temp: 98.5 F (36.9 C) 98.5 F (36.9 C) 98.1 F (36.7 C) 98.2 F (36.8 C)  TempSrc:      SpO2: 94% 97% 96% 96%  Weight:      Height:        Intake/Output Summary (Last 24 hours) at 10/19/2021 1221 Last data filed at 10/19/2021 1014 Gross per 24 hour  Intake 317.41 ml  Output --  Net 317.41 ml   Physical Exam: A&Ox3, NAD CV: RRR Pulmonary: CTA Bilaterally Abdomen: Soft, Nontender, Nondistended Vascular:  Right upper extremity: Extremity is warm and soft.  Palpable radial pulse.  Fourth finger is warm.   Laboratory: CBC    Component Value Date/Time   WBC 4.8 10/19/2021 0137   HGB 14.5 10/19/2021 0137   HCT 42.5 10/19/2021 0137   PLT 110 (L) 10/19/2021 0137   BMET    Component Value Date/Time   NA 139 10/19/2021 0137   K 3.5 10/19/2021 0137   CL 105 10/19/2021 0137   CO2 27 10/19/2021 0137   GLUCOSE 126 (H) 10/19/2021 0137   BUN 13 10/19/2021 0137   CREATININE 0.95 10/19/2021 0137   CALCIUM 8.4 (L) 10/19/2021 0137   GFRNONAA >60 10/19/2021 4388   Assessment/Planning: The  patient is a 65 year old male who presented with an ischemic right fourth finger s/p endovascular intervention  1) his hand is perfused this AM. 2) we will stop heparin drip and transition him to Eliquis. 3) patient will need to follow-up with hand surgeon Dr. Peggye Ley in the outpatient setting.  Discussed with Dr. Ellis Parents Tyreanna Bisesi PA-C 10/19/2021 12:21 PM

## 2021-10-19 NOTE — Assessment & Plan Note (Signed)
[facility]st[facility]

## 2021-10-20 ENCOUNTER — Inpatient Hospital Stay: Payer: Medicare Other

## 2021-10-20 ENCOUNTER — Telehealth: Payer: Self-pay | Admitting: *Deleted

## 2021-10-20 ENCOUNTER — Inpatient Hospital Stay (HOSPITAL_COMMUNITY)
Admit: 2021-10-20 | Discharge: 2021-10-20 | Disposition: A | Discharge status: Home / Self Care | Payer: Medicare Other | Attending: Family Medicine | Admitting: Family Medicine

## 2021-10-20 DIAGNOSIS — I429 Cardiomyopathy, unspecified: Secondary | ICD-10-CM

## 2021-10-20 DIAGNOSIS — I998 Other disorder of circulatory system: Secondary | ICD-10-CM

## 2021-10-20 DIAGNOSIS — I340 Nonrheumatic mitral (valve) insufficiency: Secondary | ICD-10-CM

## 2021-10-20 DIAGNOSIS — I77810 Thoracic aortic ectasia: Secondary | ICD-10-CM

## 2021-10-20 DIAGNOSIS — I5020 Unspecified systolic (congestive) heart failure: Secondary | ICD-10-CM

## 2021-10-20 DIAGNOSIS — R911 Solitary pulmonary nodule: Secondary | ICD-10-CM

## 2021-10-20 DIAGNOSIS — I361 Nonrheumatic tricuspid (valve) insufficiency: Secondary | ICD-10-CM

## 2021-10-20 LAB — COMPREHENSIVE METABOLIC PANEL
ALT: 32 U/L (ref 0–44)
AST: 18 U/L (ref 15–41)
Albumin: 3.5 g/dL (ref 3.5–5.0)
Alkaline Phosphatase: 57 U/L (ref 38–126)
Anion gap: 7 (ref 5–15)
BUN: 10 mg/dL (ref 8–23)
CO2: 26 mmol/L (ref 22–32)
Calcium: 8.8 mg/dL — ABNORMAL LOW (ref 8.9–10.3)
Chloride: 104 mmol/L (ref 98–111)
Creatinine, Ser: 0.91 mg/dL (ref 0.61–1.24)
GFR, Estimated: >60 mL/min (ref >60)
Glucose, Bld: 150 mg/dL — ABNORMAL HIGH (ref 70–99)
Potassium: 3.5 mmol/L (ref 3.5–5.1)
Sodium: 137 mmol/L (ref 135–145)
Total Bilirubin: 1.3 mg/dL — ABNORMAL HIGH (ref 0.3–1.2)
Total Protein: 6.3 g/dL — ABNORMAL LOW (ref 6.5–8.1)

## 2021-10-20 LAB — ECHOCARDIOGRAM COMPLETE BUBBLE STUDY
AR max vel: 3.29 cm2
AV Area VTI: 4.32 cm2
AV Area mean vel: 3.37 cm2
AV Mean grad: 1 mmHg
AV Peak grad: 2.2 mmHg
Ao pk vel: 0.75 m/s
Area-P 1/2: 3.03 cm2
MV VTI: 2.74 cm2
S' Lateral: 3.4 cm
Single Plane A4C EF: 16.6 %

## 2021-10-20 LAB — CBC WITH DIFFERENTIAL/PLATELET
Abs Immature Granulocytes: 0.01 10*3/uL (ref 0.00–0.07)
Basophils Absolute: 0 10*3/uL (ref 0.0–0.1)
Basophils Relative: 1 %
Eosinophils Absolute: 0.2 10*3/uL (ref 0.0–0.5)
Eosinophils Relative: 4 %
HCT: 44 % (ref 39.0–52.0)
Hemoglobin: 15.1 g/dL (ref 13.0–17.0)
Immature Granulocytes: 0 %
Lymphocytes Relative: 27 %
Lymphs Abs: 1.2 10*3/uL (ref 0.7–4.0)
MCH: 31.3 pg (ref 26.0–34.0)
MCHC: 34.3 g/dL (ref 30.0–36.0)
MCV: 91.1 fL (ref 80.0–100.0)
Monocytes Absolute: 0.4 10*3/uL (ref 0.1–1.0)
Monocytes Relative: 10 %
Neutro Abs: 2.5 10*3/uL (ref 1.7–7.7)
Neutrophils Relative %: 58 %
Platelets: 108 10*3/uL — ABNORMAL LOW (ref 150–400)
RBC: 4.83 MIL/uL (ref 4.22–5.81)
RDW: 12.4 % (ref 11.5–15.5)
WBC: 4.3 10*3/uL (ref 4.0–10.5)
nRBC: 0 % (ref 0.0–0.2)

## 2021-10-20 LAB — GLUCOSE, CAPILLARY
Glucose-Capillary: 156 mg/dL — ABNORMAL HIGH (ref 70–99)
Glucose-Capillary: 158 mg/dL — ABNORMAL HIGH (ref 70–99)

## 2021-10-20 LAB — PHOSPHORUS: Phosphorus: 2.7 mg/dL (ref 2.5–4.6)

## 2021-10-20 LAB — MAGNESIUM: Magnesium: 1.8 mg/dL (ref 1.7–2.4)

## 2021-10-20 MED ORDER — APIXABAN 5 MG PO TABS: 5.0000 mg | ORAL_TABLET | Freq: Two times a day (BID) | ORAL | 0 refills | Status: DC

## 2021-10-20 MED ORDER — APIXABAN 5 MG PO TABS: ORAL_TABLET | ORAL | 0 refills | Status: DC

## 2021-10-20 MED ORDER — METFORMIN HCL 500 MG PO TABS: 500.0000 mg | ORAL_TABLET | Freq: Every day | ORAL | 0 refills | Status: DC

## 2021-10-20 MED ORDER — CARVEDILOL 12.5 MG PO TABS: 12.5000 mg | ORAL_TABLET | Freq: Two times a day (BID) | ORAL | Status: DC

## 2021-10-20 MED ORDER — CARVEDILOL 6.25 MG PO TABS: 6.2500 mg | ORAL_TABLET | Freq: Two times a day (BID) | ORAL | 0 refills | Status: DC

## 2021-10-20 MED ORDER — LOSARTAN POTASSIUM 50 MG PO TABS
100.0000 mg | ORAL_TABLET | Freq: Every day | ORAL | Status: DC
  Filled 2021-10-20: qty 2

## 2021-10-20 MED ORDER — METFORMIN HCL 500 MG PO TABS: 500.0000 mg | ORAL_TABLET | Freq: Two times a day (BID) | ORAL | 1 refills | Status: DC

## 2021-10-20 MED ORDER — OXYCODONE-ACETAMINOPHEN 5-325 MG PO TABS: 1.0000 | ORAL_TABLET | ORAL | 0 refills | Status: AC | PRN

## 2021-10-20 MED ORDER — ACETAMINOPHEN 325 MG PO TABS: 650.0000 mg | ORAL_TABLET | Freq: Four times a day (QID) | ORAL | 0 refills | Status: AC | PRN

## 2021-10-20 NOTE — Telephone Encounter (Signed)
Patient currently admitted at [facility]. He needs hospital follow up and zio monitor to be mailed per provider for afib. He is newly diagnosed cardiomyopathy and new to our office. This appointment needs to be with Dr. Fletcher Anon in 2-3 weeks as he is new patient to our office per APP.   Left voicemail message to call back to review information and schedule him follow up

## 2021-10-20 NOTE — Progress Notes (Signed)
*  PRELIMINARY RESULTS* Echocardiogram 2D Echocardiogram has been performed.  Juan Olson 10/20/2021, 12:37 PM

## 2021-10-20 NOTE — Assessment & Plan Note (Addendum)
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

## 2021-10-20 NOTE — Care Management Important Message (Signed)
Important Message  Patient Details  Name: ISAIA HASSELL MRN: [id number] Date of Birth: 10-May-1956   Medicare Important Message Given:  Yes     Juliann Pulse A Sachit Gilman 10/20/2021, 11:26 AM

## 2021-10-20 NOTE — Telephone Encounter (Signed)
-----   Message from Rise Mu, [place] sent at 10/20/2021  2:54 PM EST ----- Juan Olson,  Please mail this patient a ZIO AT for A. fib at the request of Dr. Fletcher Anon.  He will need hospital follow-up with him to establish care for newly diagnosed cardiomyopathy and to evaluate his outpatient cardiac monitoring.

## 2021-10-20 NOTE — Assessment & Plan Note (Signed)
Echo with EF 30-35%, global hypokinesis, moderate LVH, grade 1 diastolic dysfunction (see report) No prior hx of HF Discussed with cardiology, who will follow with him as an outpatient Continue coreg, losartan.  Consider transitioning to ARNI and adding spironolactone and SGLT2 inhibitor outpatient.

## 2021-10-20 NOTE — Discharge Summary (Signed)
Physician Discharge Summary  Juan Olson ASN:[id number] DOB: 30-Apr-1956 DOA: 10/18/2021  PCP: Cyndi Bender, PA-C  Admit date: 10/18/2021 Discharge date: 10/20/2021  Time spent: 40 minutes  Recommendations for Outpatient Follow-up:  Follow outpatient CBC/CMP Follow with Dr. 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  Undergoing eval for embolic source at this time.  He's been in sinus on telemetry, echo notable for decreased EF.  Vascular has contacted Dr. Peggye Ley with hand, may need intrinsic hand bypass, Dr. Cherylann Parr office with follow up with Juan Olson outpatient.  He's been started on eliquis and will continue this outpatient.  Cardiology is arranging follow up and he'll follow up with heme as well for hypercoagulable w/u.    See below for additional details  Hospital Course:  * Ischemic finger_right 4th finger Presented with pain and numbness to 4th finger, cool and pale, poor cap refill S/p vascular eval -> s/p  thoracic aortogram and selective right upper extremity angiogram, mechanical thrombectomy to R ulnar artery, percutaneous transluminal angioplasty Normal origins to great vessels without sig proximal stenosis and bovine configuration of great vessels - right subclavian, axillary, and brachial arteries patent.  Patent right radial artery.  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   Completion imaging showed improvement, but remained significantly stenotic, slight filling of 4th digit on angiogram Will need referral to hand surgery for consideration of intrinsic hand bypass (this has been arranged by vascular) Will transition to eliquis per vascular CTA chest (without aortic dissection no ulcerated plaque - notable for ectasia of ascending thoracic aorta ectasia), LE [place] (without DVT), echo with doppler (notable for low EF -> no evidence of interatrial shunt - cardiology follow up arranged), continue tele monitoring (sinus with PAC's).  outpatient heme follow up for hypercoaguable w/u Needs outpatient hand follow up Cardiology follow up and ziopatch   Systolic heart failure ([place]) Echo with EF 30-35%, global hypokinesis, moderate LVH, grade 1 diastolic dysfunction (see report) No prior hx of HF Discussed with cardiology, who will follow with him as an outpatient Continue coreg, losartan.  Consider transitioning to ARNI and adding spironolactone and SGLT2 inhibitor outpatient.   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 Ultrasound guidance vascular access to right femoral artery. 2. Catheter placement to right radial artery and right ulnar arteries     from right femoral approach. 3. Thoracic aortogram and selective right upper extremity angiogram     including selective images of the radial and ulnar arteries. 4.  Mechanical thrombectomy to the right ulnar artery with the penumbra CAT 3 device 5.  Percutaneous transluminal angioplasty of the right distal ulnar artery with 2 mm diameter by 6 cm length angioplasty balloon 6. StarClose closure device right femoral artery.  LE US IMPRESSION: 1. No evidence of deep venous thrombosis within either lower extremity.  echo IMPRESSIONS     1. Left ventricular ejection fraction, by estimation, is 30 to 35%. The  left ventricle has moderately decreased function. The left ventricle  demonstrates global hypokinesis. There is moderate left ventricular  hypertrophy. Left ventricular diastolic  parameters are consistent with Grade I diastolic dysfunction (impaired  relaxation).   2. Right ventricular systolic function is normal. The right ventricular  size is normal.   3. The mitral valve is normal in structure. Mild mitral valve  regurgitation. No evidence of mitral stenosis.   4. The aortic valve is normal in structure. Aortic valve regurgitation is  not visualized. No aortic stenosis is present.   5. There is mild dilatation of the ascending aorta and of the aortic  root, measuring 44 mm.   6. The inferior vena cava is normal in size with greater than 50%  respiratory variability, suggesting right atrial pressure of 3 mmHg.   7. Agitated saline contrast bubble study was negative, with no evidence  of any interatrial shunt.   Consultations: Vascular Cardiology over phone  Discharge Exam: Vitals:   10/20/21 0721 10/20/21 1135  BP: (!) 151/78 (!) 143/88  Pulse: (!) 56 66  Resp: 15 14  Temp: 97.9 F (36.6  C) 98 F (36.7 C)  SpO2: 97% 97%   Eager for discharge  General: No acute distress. Cardiovascular: Heart sounds show Juan Olson regular rate, and rhythm. Lungs: Clear to auscultation bilaterally Abdomen: Soft, nontender, nondistended  Neurological: Alert and oriented 3. Moves all extremities 4 . Cranial nerves II through XII grossly intact. Skin: right ring finger about the same as yesterday, dusky, sluggish cap refill (discussed with vascular who recommended outpatient follow up with hand)  Discharge Instructions   Discharge Instructions     ([place]) Call MD:  Anytime you have any of the following symptoms: 1) 3 pound weight gain in 24 hours or 5 pounds in 1 week 2) shortness of breath, with or without Vincenzina Jagoda dry hacking cough 3) swelling in the hands, feet or stomach 4) if you have to sleep on extra pillows at night in order to breathe.   Complete by: As directed    Call MD for:  difficulty breathing, headache or visual disturbances   Complete by: As directed    Call MD for:  extreme fatigue   Complete by: As directed    Call MD for:  hives   Complete by: As directed    Call MD for:  persistant dizziness or light-headedness   Complete by: As directed    Call MD for:  persistant nausea and vomiting   Complete by: As directed    Call MD for:  redness, tenderness, or signs of infection (pain, swelling, redness, odor or green/yellow discharge around incision site)   Complete by: As directed    Call MD for:  severe uncontrolled pain   Complete by: As directed    Call MD for:  temperature >100.4   Complete by: As directed    Diet - low sodium heart healthy   Complete by: As directed    Discharge instructions   Complete by: As directed    You were seen for an ischemic right ring finger.    You were seen by vascular and had an angiogram and thrombectomy and angioplasty.  You'll need to see hand surgery outpatient.  Dr. Cherylann Parr clinic should be calling you to arrange follow  up.  If you have worsening pain or discoloration of your finger, call or return to care.  You'll be discharged on eliquis.  You should continue this indefinitely for now while we workup the cause of your ischemic finger.  We'll discharge you with the eliquis with Aizen Duval refill. Take 10 mg twice daily for 6 days, then start 5 mg twice daily.  You have heart failure on your echocardiogram.  We'll plan for you to be seen by cardiology as an outpatient.  They'll arrange follow up and Oline Belk ziopatch to monitor for abnormal rhythms.  You should follow up with hematology as an outpatient for Gwendolyne Welford hypercoagulable workup.  Your heart rate is Gali Spinney little on the slow side, we'll reduce your coreg dose.    You have diabetes.  We'll start you on metformin as an outpatient.  This can be uptitrated with your PCP as tolerated.  You need follow up of your aortic ectasia as an outpatient.  You need follow up of your pulmonary nodule outpatient with your PCP.  Return for new, recurrent, or worsening symptoms.  Please ask your PCP to request records from this hospitalization so they know what was done and what the next steps will be.   Discharge wound care:   Complete by: As directed    Per vascular   Increase activity slowly   Complete by: As directed       Allergies as of 10/20/2021   No Known Allergies      Medication List     STOP taking these medications    acetaminophen-codeine 300-30 MG tablet Commonly known as: TYLENOL #3   Chromium 200 MCG Tabs   Cinnamon 500 MG Tabs   Dilt-XR 120 MG 24 hr capsule Generic drug: diltiazem   escitalopram 10 MG tablet Commonly known as: LEXAPRO   lisinopril 10 MG tablet Commonly known as: ZESTRIL   Magnesium 500 MG Tabs   metoprolol succinate 50 MG 24 hr tablet Commonly known as: TOPROL-XL   metroNIDAZOLE 0.75 % cream Commonly known as: METROCREAM   PROBIOTIC-PREBIOTIC PO   TURMERIC PO   Vitamin C 500 MG Caps   vitamin E 1000 UNIT capsule        TAKE these medications    acetaminophen 325 MG tablet Commonly known as: TYLENOL Take 2 tablets (650 mg total) by mouth every 6 (six) hours as needed for mild pain or fever.   ALPRAZolam 0.25 MG tablet Commonly known as: XANAX Take 0.25 mg by mouth at bedtime as needed for anxiety.   apixaban 5 MG Tabs tablet Commonly known as: ELIQUIS Take 2 tablets (10 mg total) by mouth 2 (two) times daily for 6 days, THEN 1 tablet (5 mg total) 2 (two) times daily for 24 days. Start taking on: October 20, 2021   apixaban 5 MG Tabs tablet Commonly known as: ELIQUIS Take 1 tablet (5 mg total) by mouth 2 (two) times daily.  Start taking on: November 18, 2021   carvedilol 6.25 MG tablet Commonly known as: COREG Take 1 tablet (6.25 mg total) by mouth 2 (two) times daily. What changed:  medication strength how much to take   diazepam 5 MG tablet Commonly known as: VALIUM Take 5 mg by mouth every 8 (eight) hours as needed for anxiety.   losartan 100 MG tablet Commonly known as: COZAAR Take 100 mg by mouth daily.   metFORMIN 500 MG tablet Commonly known as: Glucophage Take 1 tablet (500 mg total) by mouth daily with breakfast.   ondansetron 4 MG disintegrating tablet Commonly known as: ZOFRAN-ODT Take 4 mg by mouth every 8 (eight) hours as needed.   oxyCODONE-acetaminophen 5-325 MG tablet Commonly known as: PERCOCET/ROXICET Take 1 tablet by mouth every 4 (four) hours as needed for up to 5 days for moderate pain.   promethazine 25 MG tablet Commonly known as: PHENERGAN Take 25 mg by mouth 3 (three) times daily as needed for nausea.   rosuvastatin 20 MG tablet Commonly known as: CRESTOR Take 20 mg by mouth daily.               Discharge Care Instructions  (From admission, onward)           Start     Ordered   10/20/21 0000  Discharge wound care:       Comments: Per vascular   10/20/21 1546           No Known Allergies  Follow-up Information      Rutherford Limerick, MD Follow up.   Specialty: Orthopedic Surgery Why: Dr. Cherylann Parr office should call you for Andry Bogden follow up plan, if you don't hear from them, please call        Dew, Erskine Squibb, MD Follow up.   Specialties: Vascular Surgery, Radiology, Interventional Cardiology Contact information: [place] [address] [phone]         Wellington Hampshire, MD Follow up.   Specialty: Cardiology Why: Cardiology should arrange follow up for your heart failure  You'll get Dominiq Fontaine ziopatch mailed to you to look for abnormal heart rhythms Contact information: [place] [address] [phone]         Lloyd Huger, MD Follow up.   Specialty: Oncology Why: you'll get follow up with Dr. Grayland Ormond for [place] hypercoagulable workup. OFFICE WILL CALL WHEN APPOINTMENT IS SET. Contact information: [place] [address] [phone]         Cyndi Bender, PA-C Follow up.   Specialty: Physician Assistant Contact information: [place]  [address] [phone]                  The results of significant diagnostics from this hospitalization (including imaging, microbiology, ancillary and laboratory) are listed below for reference.    Significant Diagnostic Studies: PERIPHERAL VASCULAR CATHETERIZATION  Result Date: 10/18/2021 See surgical note for result.  US Venous Img Lower Bilateral (DVT)  Result Date: 10/19/2021 CLINICAL DATA:  Embolic infarction, right groin catheterization, anticoagulation therapy EXAM: BILATERAL LOWER EXTREMITY VENOUS DOPPLER ULTRASOUND TECHNIQUE: Gray-scale sonography with compression, as well as color and duplex ultrasound, were performed to evaluate the deep venous system(s) from the level of the common femoral vein through the popliteal and proximal calf veins. COMPARISON:  None. FINDINGS: VENOUS Normal compressibility of the common femoral, superficial  femoral, and popliteal veins, as well as the visualized calf veins. Visualized portions of  profunda femoral vein and great saphenous vein unremarkable. No filling defects to suggest DVT on grayscale or color Doppler imaging. Doppler waveforms show normal direction of venous flow, normal respiratory plasticity and response to augmentation. OTHER None. Limitations: none IMPRESSION: 1. No evidence of deep venous thrombosis within either lower extremity. Electronically Signed   By: Randa Ngo M.D.   On: 10/19/2021 16:40   CT ANGIO CHEST AORTA W/CM &/OR WO/CM  Result Date: 10/19/2021 CLINICAL DATA:  Ischemia to right ring finger, evaluate for source for arterial embolus EXAM: CT ANGIOGRAPHY CHEST WITH CONTRAST TECHNIQUE: Multidetector CT imaging of the chest was performed using the standard protocol during bolus administration of intravenous contrast. Multiplanar CT image reconstructions and MIPs were obtained to evaluate the vascular anatomy. CONTRAST:  72mL OMNIPAQUE IOHEXOL 350 MG/ML SOLN COMPARISON:  None FINDINGS: Cardiovascular: In the noncontrast images, there is no demonstrable mural thrombus. In the postcontrast images, there is homogeneous enhancement in thoracic aorta. There is no demonstrable intimal flap or ulcerated plaque. There is ectasia of ascending thoracic aorta measuring 4.6 cm. There are no filling defects in the main pulmonary artery branches in the mediastinum. Major brachiocephalic arteries appear to be patent in their proximal courses. There is dense contrast in the right axillary and subclavian veins limit evaluation of adjacent arterial structures. Right subclavian and axillary arteries are not optimally evaluated. Mediastinum/Nodes: No significant lymphadenopathy is seen Lungs/Pleura: In the image 85 of series 6, there is Rafay Dahan 4 mm noncalcified pleural-based nodule in the left lower lobe. There is no focal pulmonary consolidation. There is no pleural effusion or pneumothorax. Upper  Abdomen: There is marked fatty infiltration in the liver. No other significant abnormality is seen in the upper abdomen. Musculoskeletal: Unremarkable. Review of the MIP images confirms the above findings. IMPRESSION: There is no evidence of thoracic aortic dissection. There is no demonstrable ulcerated plaque in the thoracic aorta. There is ectasia of ascending thoracic aorta measuring 4.6 cm in diameter. Major brachiocephalic arteries are patent in their proximal courses. Evaluation of right subclavian and right axillary arteries is technically limited due to beam hardening artifacts caused by dense contrast in the venous system. There is no evidence of central pulmonary artery embolism. There is no focal pulmonary consolidation. There is marked fatty infiltration in the liver. There is 4 mm pleural-based nodule in the left lower lobe. If the patient does not have any risk factors for lung malignancy, no further follow-up is needed. If there any such risk factors, follow-up CT in 12 months may be considered. Electronically Signed   By: Elmer Picker M.D.   On: 10/19/2021 19:54   ECHOCARDIOGRAM COMPLETE BUBBLE STUDY  Result Date: 10/20/2021    ECHOCARDIOGRAM REPORT   Patient Name:   Juan Olson Date of Exam: 10/20/2021 Medical Rec #:  [id number]     Height:       70.0 in Accession #:    [id number]    Weight:       230.0 lb Date of Birth:  10-22-1956    BSA:          2.215 m Patient Age:    31 years      BP:           143/88 mmHg Patient Gender: M             HR:           66 bpm. Exam Location:  [facility] Procedure: 2D Echo, Cardiac Doppler, Color Doppler and Saline Contrast  Bubble            Study Indications:     Ischemic Finger  History:         Patient has no prior history of Echocardiogram examinations.                  Mitral Valve Prolapse; Risk Factors:Hypertension.  Sonographer:     Sherrie Sport Referring Phys:  [place] Diagnosing Phys: Ida Rogue MD IMPRESSIONS  1. Left  ventricular ejection fraction, by estimation, is 30 to 35%. The left ventricle has moderately decreased function. The left ventricle demonstrates global hypokinesis. There is moderate left ventricular hypertrophy. Left ventricular diastolic parameters are consistent with Grade I diastolic dysfunction (impaired relaxation).  2. Right ventricular systolic function is normal. The right ventricular size is normal.  3. The mitral valve is normal in structure. Mild mitral valve regurgitation. No evidence of mitral stenosis.  4. The aortic valve is normal in structure. Aortic valve regurgitation is not visualized. No aortic stenosis is present.  5. There is mild dilatation of the ascending aorta and of the aortic root, measuring 44 mm.  6. The inferior vena cava is normal in size with greater than 50% respiratory variability, suggesting right atrial pressure of 3 mmHg.  7. Agitated saline contrast bubble study was negative, with no evidence of any interatrial shunt. FINDINGS  Left Ventricle: Left ventricular ejection fraction, by estimation, is 30 to 35%. The left ventricle has moderately decreased function. The left ventricle demonstrates global hypokinesis. The left ventricular internal cavity size was normal in size. There is moderate left ventricular hypertrophy. Left ventricular diastolic parameters are consistent with Grade I diastolic dysfunction (impaired relaxation). Right Ventricle: The right ventricular size is normal. No increase in right ventricular wall thickness. Right ventricular systolic function is normal. Left Atrium: Left atrial size was normal in size. Right Atrium: Right atrial size was normal in size. Pericardium: There is no evidence of pericardial effusion. Mitral Valve: The mitral valve is normal in structure. Mild mitral valve regurgitation. No evidence of mitral valve stenosis. MV peak gradient, 2.8 mmHg. The mean mitral valve gradient is 1.0 mmHg. Tricuspid Valve: The tricuspid valve is normal  in structure. Tricuspid valve regurgitation is mild . No evidence of tricuspid stenosis. Aortic Valve: The aortic valve is normal in structure. Aortic valve regurgitation is not visualized. No aortic stenosis is present. Aortic valve mean gradient measures 1.0 mmHg. Aortic valve peak gradient measures 2.2 mmHg. Aortic valve area, by VTI measures 4.32 cm. Pulmonic Valve: The pulmonic valve was normal in structure. Pulmonic valve regurgitation is not visualized. No evidence of pulmonic stenosis. Aorta: The aortic root is normal in size and structure. There is mild dilatation of the ascending aorta and of the aortic root, measuring 44 mm. Venous: The inferior vena cava is normal in size with greater than 50% respiratory variability, suggesting right atrial pressure of 3 mmHg. IAS/Shunts: No atrial level shunt detected by color flow Doppler. Agitated saline contrast was given intravenously to evaluate for intracardiac shunting. Agitated saline contrast bubble study was negative, with no evidence of any interatrial shunt.  LEFT VENTRICLE PLAX 2D LVIDd:         4.30 cm LVIDs:         3.40 cm LV PW:         1.30 cm LV IVS:        1.30 cm LVOT diam:     2.20 cm LV SV:  54 LV SV Index:   24 LVOT Area:     3.80 cm  LV Volumes (MOD) LV vol d, MOD A4C: 91.3 ml LV vol s, MOD A4C: 76.1 ml LV SV MOD A4C:     91.3 ml RIGHT VENTRICLE RV S prime:     14.10 cm/s TAPSE (M-mode): 4.7 cm LEFT ATRIUM             Index        RIGHT ATRIUM           Index LA diam:        3.90 cm 1.76 cm/m   RA Area:     15.20 cm LA Vol (A2C):   52.2 ml 23.57 ml/m  RA Volume:   34.50 ml  15.58 ml/m LA Vol (A4C):   37.6 ml 16.97 ml/m LA Biplane Vol: 45.0 ml 20.32 ml/m  AORTIC VALVE                    PULMONIC VALVE AV Area (Vmax):    3.29 cm     PV Vmax:        0.54 m/s AV Area (Vmean):   3.37 cm     PV Vmean:       35.750 cm/s AV Area (VTI):     4.32 cm     PV VTI:         0.099 m AV Vmax:           74.60 cm/s   PV Peak grad:   1.1 mmHg AV  Vmean:          52.700 cm/s  PV Mean grad:   0.5 mmHg AV VTI:            0.125 m      RVOT Peak grad: 3 mmHg AV Peak Grad:      2.2 mmHg AV Mean Grad:      1.0 mmHg LVOT Vmax:         64.60 cm/s LVOT Vmean:        46.700 cm/s LVOT VTI:          0.142 m LVOT/AV VTI ratio: 1.14  AORTA Ao Root diam: 4.40 cm MITRAL VALVE               TRICUSPID VALVE MV Area (PHT): 3.03 cm    TR Peak grad:   12.1 mmHg MV Area VTI:   2.74 cm    TR Vmax:        174.00 cm/s MV Peak grad:  2.8 mmHg MV Mean grad:  1.0 mmHg    SHUNTS MV Vmax:       0.83 m/s    Systemic VTI:  0.14 m MV Vmean:      48.5 cm/s   Systemic Diam: 2.20 cm MV Decel Time: 250 msec    Pulmonic VTI:  0.126 m MV E velocity: 56.10 cm/s MV Delan Ksiazek velocity: 87.40 cm/s MV E/Deisi Salonga ratio:  0.64 Ida Rogue MD Electronically signed by Ida Rogue MD Signature Date/Time: 10/20/2021/12:58:57 PM    Final     Microbiology: Recent Results (from the past 240 hour(s))  Resp Panel by RT-PCR (Flu Netasha Wehrli&B, Covid) Nasopharyngeal Swab     Status: None   Collection Time: 10/18/21  7:40 AM   Specimen: Nasopharyngeal Swab; Nasopharyngeal(NP) swabs in vial transport medium  Result Value Ref Range Status   SARS Coronavirus 2 by RT PCR NEGATIVE NEGATIVE Final    Comment: (NOTE) SARS-CoV-2 target  nucleic acids are NOT DETECTED.  The SARS-CoV-2 RNA is generally detectable in upper respiratory specimens during the acute phase of infection. The lowest concentration of SARS-CoV-2 viral copies this assay can detect is 138 copies/mL. Carnetta Losada negative result does not preclude SARS-Cov-2 infection and should not be used as the sole basis for treatment or other patient management decisions. Annalea Alguire negative result may occur with  improper specimen collection/handling, submission of specimen other than nasopharyngeal swab, presence of viral mutation(s) within the areas targeted by this assay, and inadequate number of viral copies(<138 copies/mL). Crew Goren negative result must be combined with clinical  observations, patient history, and epidemiological information. The expected result is Negative.  Fact Sheet for Patients:  EntrepreneurPulse.com.au  Fact Sheet for Healthcare Providers:  IncredibleEmployment.be  This test is no t yet approved or cleared by the [place] FDA and  has been authorized for detection and/or diagnosis of SARS-CoV-2 by FDA under an Emergency Use Authorization (EUA). This EUA will remain  in effect (meaning this test can be used) for the duration of the COVID-19 declaration under Section 564(b)(1) of the Act, 21 U.S.C.section 360bbb-3(b)(1), unless the authorization is terminated  or revoked sooner.       Influenza Lindzie Boxx by PCR NEGATIVE NEGATIVE Final   Influenza B by PCR NEGATIVE NEGATIVE Final    Comment: (NOTE) The Xpert Xpress SARS-CoV-2/FLU/RSV plus assay is intended as an aid in the diagnosis of influenza from Nasopharyngeal swab specimens and should not be used as Brita Jurgensen sole basis for treatment. Nasal washings and aspirates are unacceptable for Xpert Xpress SARS-CoV-2/FLU/RSV testing.  Fact Sheet for Patients: EntrepreneurPulse.com.au  Fact Sheet for Healthcare Providers: IncredibleEmployment.be  This test is not yet approved or cleared by the [place] FDA and has been authorized for detection and/or diagnosis of SARS-CoV-2 by FDA under an Emergency Use Authorization (EUA). This EUA will remain in effect (meaning this test can be used) for the duration of the COVID-19 declaration under Section 564(b)(1) of the Act, 21 U.S.C. section 360bbb-3(b)(1), unless the authorization is terminated or revoked.  Performed at [facility], [address] [place], [place] [address]      Labs: Basic Metabolic Panel: Recent Labs  Lab 10/18/21 0740 10/19/21 0137 10/20/21 0518  NA 140 139 137  K 3.8 3.5 3.5  CL 107 105 104  CO2 25 27 26   GLUCOSE 209* 126* 150*   BUN 13 13 10   CREATININE 1.10 0.95 0.91  CALCIUM 8.7* 8.4* 8.8*  MG  --   --  1.8  PHOS  --   --  2.7   Liver Function Tests: Recent Labs  Lab 10/18/21 0740 10/20/21 0518  AST 40 18  ALT 57* 32  ALKPHOS 82 57  BILITOT 1.0 1.3*  PROT 6.9 6.3*  ALBUMIN 4.0 3.5   No results for input(s): LIPASE, AMYLASE in the last 168 hours. No results for input(s): AMMONIA in the last 168 hours. CBC: Recent Labs  Lab 10/18/21 0740 10/19/21 0137 10/20/21 0518  WBC 3.6* 4.8 4.3  NEUTROABS 1.8  --  2.5  HGB 16.6 14.5 15.1  HCT 48.5 42.5 44.0  MCV 92.4 92.4 91.1  PLT 130* 110* 108*   Cardiac Enzymes: No results for input(s): CKTOTAL, CKMB, CKMBINDEX, TROPONINI in the last 168 hours. BNP: BNP (last 3 results) No results for input(s): BNP in the last 8760 hours.  ProBNP (last 3 results) No results for input(s): PROBNP in the last 8760 hours.  CBG: Recent Labs  Lab 10/19/21  1117 10/19/21 1529 10/19/21 2023 10/20/21 0722 10/20/21 1136  GLUCAP 190* 150* 144* 156* 158*       Signed:  Fayrene Helper MD.  [facility] 10/20/2021, 4:57 PM

## 2021-10-20 NOTE — Telephone Encounter (Signed)
Spoke with patient and reviewed provider recommendations. Advised I would have the monitor mailed and it will contain the instructions. Patient was scheduled to come in and see Dr. Fletcher Anon on 12/21 per APP request. Patient verbalized understanding of recommendations, was agreeable with plan, and had no further questions at this time.

## 2021-10-20 NOTE — Assessment & Plan Note (Signed)
4 mm pleural based nodule in left lower lobe, follow with PCP, consider repeat imaging

## 2021-10-25 ENCOUNTER — Ambulatory Visit: Payer: Medicare Other | Admitting: Cardiovascular Disease

## 2021-11-07 ENCOUNTER — Ambulatory Visit (INDEPENDENT_AMBULATORY_CARE_PROVIDER_SITE_OTHER): Payer: Medicare Other | Admitting: Cardiovascular Disease

## 2021-11-07 ENCOUNTER — Encounter: Payer: Self-pay | Admitting: Cardiovascular Disease

## 2021-11-07 ENCOUNTER — Other Ambulatory Visit: Payer: Self-pay

## 2021-11-07 VITALS — BP 140/70 | HR 80 | Ht 70.0 in | Wt 235.4 lb

## 2021-11-07 DIAGNOSIS — I5022 Chronic systolic (congestive) heart failure: Secondary | ICD-10-CM | POA: Diagnosis not present

## 2021-11-07 DIAGNOSIS — I742 Embolism and thrombosis of arteries of the upper extremities: Secondary | ICD-10-CM

## 2021-11-07 DIAGNOSIS — E785 Hyperlipidemia, unspecified: Secondary | ICD-10-CM
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
## 2021-11-07 NOTE — Patient Instructions (Signed)
Medication Instructions:  Your physician recommends that you continue on your current medications as directed. Please refer to the Current Medication list given to you today.  *If you need a refill on your cardiac medications before your next appointment, please call your pharmacy*   Lab Work: None ordered If you have labs (blood work) drawn today and your tests are completely normal, you will receive your results only by: [place] (if you have MyChart) OR A paper copy in the mail If you have any lab test that is abnormal or we need to change your treatment, we will call you to review the results.   Testing/Procedures: Your physician has requested that you have a lexiscan myoview. For further information please visit [email or web address]. Please follow instruction sheet, as given.  Please apply your cardiac zio monitor that you have at home (To be worn for 14 days).  This monitor is a medical device that records the hearts electrical activity. Doctors most often use these monitors to diagnose arrhythmias. Arrhythmias are problems with the speed or rhythm of the heartbeat. The monitor is a small device applied to your chest. You can wear one while you do your normal daily activities. While wearing this monitor if you have any symptoms to push the button and record what you felt. Once you have worn this monitor for the period of time provider prescribed (Usually 14 days), you will return the monitor device in the postage paid box. Once it is returned they will download the data collected and provide [place] with a report which the provider will then review and we will call you with those results. Important tips:  Avoid showering during the first 24 hours of wearing the monitor. Avoid excessive sweating to help maximize wear time. Do not submerge the device, no hot tubs, and no swimming pools. Keep any lotions or oils away from the patch. After 24 hours you may shower with the patch on. Take  brief showers with your back facing the shower head.  Do not remove patch once it has been placed because that will interrupt data and decrease adhesive wear time. Push the button when you have any symptoms and write down what you were feeling. Once you have completed wearing your monitor, remove and place into box which has postage paid and place in your outgoing mailbox.  If for some reason you have misplaced your box then call our office and we can provide another box and/or mail it off for you.      Follow-Up: At [facility], you and your health needs are our priority.  As part of our continuing mission to provide you with exceptional heart care, we have created designated Provider Care Teams.  These Care Teams include your primary Cardiologist (physician) and Advanced Practice Providers (APPs -  Physician Assistants and Nurse Practitioners) who all work together to provide you with the care you need, when you need it.  We recommend signing up for the patient portal called "MyChart".  Sign up information is provided on this After Visit Summary.  MyChart is used to connect with patients for Virtual Visits (Telemedicine).  Patients are able to view lab/test results, encounter notes, upcoming appointments, etc.  Non-urgent messages can be sent to your provider as well.   To learn more about what you can do with MyChart, go to [email or web address].    Your next appointment:   4 week(s)  The format for your next appointment:   In Person  Provider:  You may see Kathlyn Sacramento, MD or one of the following Advanced Practice Providers on your designated Care Team:   Murray Hodgkins, NP Christell Faith, PA-C Cadence Kathlen Mody, PA-C{    Other Instructions [place]  Your caregiver has ordered a Stress Test with nuclear imaging. The purpose of this test is to evaluate the blood supply to your heart muscle. This procedure is referred to as a "Non-Invasive Stress Test." This is because other  than having an IV started in your vein, nothing is inserted or "invades" your body. Cardiac stress tests are done to find areas of poor blood flow to the heart by determining the extent of coronary artery disease (CAD). Some patients exercise on a treadmill, which naturally increases the blood flow to your heart, while others who are  unable to walk on a treadmill due to physical limitations have a pharmacologic/chemical stress agent called Lexiscan . This medicine will mimic walking on a treadmill by temporarily increasing your coronary blood flow.   Please note: these test may take anywhere between 2-4 hours to complete  PLEASE REPORT TO [place] AT THE FIRST DESK WILL DIRECT YOU WHERE TO GO  Date of Procedure:_____________________________________  Arrival Time for Procedure:______________________________   PLEASE NOTIFY THE OFFICE AT LEAST 24 HOURS IN ADVANCE IF YOU ARE UNABLE TO KEEP YOUR APPOINTMENT.  [phone] AND  PLEASE NOTIFY NUCLEAR MEDICINE AT [facility] AT LEAST 24 HOURS IN ADVANCE IF YOU ARE UNABLE TO KEEP YOUR APPOINTMENT. [phone]  How to prepare for your Myoview test:  Do not eat or drink after midnight No caffeine for 24 hours prior to test No smoking 24 hours prior to test. Your medication may be taken with water.  If your doctor stopped a medication because of this test, do not take that medication. Ladies, please do not wear dresses.  Skirts or pants are appropriate. Please wear a short sleeve shirt. No cologne or lotion. Wear comfortable walking shoes.

## 2021-12-14 ENCOUNTER — Ambulatory Visit: Payer: Medicare Other | Admitting: Cardiovascular Disease

## 2021-12-20 ENCOUNTER — Other Ambulatory Visit: Payer: Self-pay

## 2021-12-20 ENCOUNTER — Ambulatory Visit: Payer: Medicare Other | Attending: Orthopedic Surgery

## 2021-12-20 DIAGNOSIS — M6281 Muscle weakness (generalized): Secondary | ICD-10-CM | POA: Diagnosis present

## 2021-12-20 DIAGNOSIS — I998 Other disorder of circulatory system: Secondary | ICD-10-CM | POA: Insufficient documentation

## 2021-12-20 DIAGNOSIS — R278 Other lack of coordination: Secondary | ICD-10-CM | POA: Insufficient documentation
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
## 2021-12-26 ENCOUNTER — Other Ambulatory Visit: Payer: Self-pay

## 2021-12-26 ENCOUNTER — Ambulatory Visit: Payer: Medicare Other

## 2021-12-26 DIAGNOSIS — I998 Other disorder of circulatory system: Secondary | ICD-10-CM

## 2021-12-26 DIAGNOSIS — M6281 Muscle weakness (generalized): Secondary | ICD-10-CM

## 2021-12-26 DIAGNOSIS — R278 Other lack of coordination: Secondary | ICD-10-CM
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
## 2021-12-27 ENCOUNTER — Ambulatory Visit: Payer: Medicare Other

## 2021-12-29 ENCOUNTER — Ambulatory Visit: Payer: Medicare Other

## 2021-12-29 ENCOUNTER — Other Ambulatory Visit: Payer: Self-pay

## 2021-12-29 DIAGNOSIS — I998 Other disorder of circulatory system: Secondary | ICD-10-CM

## 2021-12-29 DIAGNOSIS — M6281 Muscle weakness (generalized): Secondary | ICD-10-CM | POA: Diagnosis not present

## 2021-12-29 DIAGNOSIS — R278 Other lack of coordination: Secondary | ICD-10-CM
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
## 2022-01-03 ENCOUNTER — Ambulatory Visit: Payer: Medicare Other | Attending: Orthopedic Surgery

## 2022-01-03 ENCOUNTER — Other Ambulatory Visit: Payer: Self-pay

## 2022-01-03 DIAGNOSIS — R278 Other lack of coordination: Secondary | ICD-10-CM | POA: Insufficient documentation

## 2022-01-03 DIAGNOSIS — I998 Other disorder of circulatory system: Secondary | ICD-10-CM | POA: Diagnosis present

## 2022-01-03 DIAGNOSIS — M6281 Muscle weakness (generalized): Secondary | ICD-10-CM | POA: Diagnosis present
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
## 2022-01-05 ENCOUNTER — Ambulatory Visit: Payer: Medicare Other

## 2022-01-05 ENCOUNTER — Other Ambulatory Visit: Payer: Self-pay

## 2022-01-05 DIAGNOSIS — I998 Other disorder of circulatory system: Secondary | ICD-10-CM

## 2022-01-05 DIAGNOSIS — R278 Other lack of coordination: Secondary | ICD-10-CM

## 2022-01-05 DIAGNOSIS — M6281 Muscle weakness (generalized): Secondary | ICD-10-CM
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
## 2022-01-09 ENCOUNTER — Other Ambulatory Visit: Payer: Self-pay

## 2022-01-09 ENCOUNTER — Ambulatory Visit: Payer: Medicare Other

## 2022-01-09 DIAGNOSIS — I998 Other disorder of circulatory system: Secondary | ICD-10-CM

## 2022-01-09 DIAGNOSIS — M6281 Muscle weakness (generalized): Secondary | ICD-10-CM | POA: Diagnosis not present

## 2022-01-09 DIAGNOSIS — R278 Other lack of coordination: Secondary | ICD-10-CM
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
## 2022-01-16 ENCOUNTER — Ambulatory Visit: Payer: Medicare Other

## 2022-01-16 ENCOUNTER — Other Ambulatory Visit: Payer: Self-pay

## 2022-01-16 DIAGNOSIS — I998 Other disorder of circulatory system: Secondary | ICD-10-CM

## 2022-01-16 DIAGNOSIS — R278 Other lack of coordination: Secondary | ICD-10-CM

## 2022-01-16 DIAGNOSIS — M6281 Muscle weakness (generalized): Secondary | ICD-10-CM | POA: Diagnosis not present
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
## 2022-01-19 ENCOUNTER — Ambulatory Visit: Payer: Medicare Other

## 2022-01-19 ENCOUNTER — Other Ambulatory Visit: Payer: Self-pay

## 2022-01-19 DIAGNOSIS — I998 Other disorder of circulatory system: Secondary | ICD-10-CM

## 2022-01-19 DIAGNOSIS — R278 Other lack of coordination: Secondary | ICD-10-CM

## 2022-01-19 DIAGNOSIS — M6281 Muscle weakness (generalized): Secondary | ICD-10-CM | POA: Diagnosis not present
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
## 2022-01-23 ENCOUNTER — Other Ambulatory Visit: Payer: Self-pay

## 2022-01-23 ENCOUNTER — Ambulatory Visit: Payer: Medicare Other

## 2022-01-23 DIAGNOSIS — M6281 Muscle weakness (generalized): Secondary | ICD-10-CM

## 2022-01-23 DIAGNOSIS — R278 Other lack of coordination: Secondary | ICD-10-CM

## 2022-01-23 DIAGNOSIS — I998 Other disorder of circulatory system: Secondary | ICD-10-CM
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
## 2022-01-26 ENCOUNTER — Ambulatory Visit: Payer: Medicare Other

## 2022-01-26 ENCOUNTER — Other Ambulatory Visit: Payer: Self-pay

## 2022-01-26 DIAGNOSIS — I998 Other disorder of circulatory system: Secondary | ICD-10-CM

## 2022-01-26 DIAGNOSIS — M6281 Muscle weakness (generalized): Secondary | ICD-10-CM
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
## 2022-01-31 ENCOUNTER — Ambulatory Visit: Payer: Medicare Other

## 2022-01-31 ENCOUNTER — Other Ambulatory Visit: Payer: Self-pay

## 2022-01-31 DIAGNOSIS — I998 Other disorder of circulatory system: Secondary | ICD-10-CM

## 2022-01-31 DIAGNOSIS — M6281 Muscle weakness (generalized): Secondary | ICD-10-CM | POA: Diagnosis not present
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
## 2022-02-02 ENCOUNTER — Ambulatory Visit: Payer: Medicare Other

## 2022-02-02 DIAGNOSIS — I998 Other disorder of circulatory system: Secondary | ICD-10-CM

## 2022-02-02 DIAGNOSIS — M6281 Muscle weakness (generalized): Secondary | ICD-10-CM | POA: Diagnosis not present
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
## 2022-02-06 ENCOUNTER — Ambulatory Visit: Payer: Medicare Other | Attending: Orthopedic Surgery

## 2022-02-06 DIAGNOSIS — M6281 Muscle weakness (generalized): Secondary | ICD-10-CM | POA: Insufficient documentation

## 2022-02-06 DIAGNOSIS — I998 Other disorder of circulatory system: Secondary | ICD-10-CM | POA: Diagnosis present

## 2022-02-06 DIAGNOSIS — R278 Other lack of coordination: Secondary | ICD-10-CM | POA: Diagnosis present
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
## 2022-02-09 ENCOUNTER — Ambulatory Visit: Payer: Medicare Other

## 2022-02-09 DIAGNOSIS — M6281 Muscle weakness (generalized): Secondary | ICD-10-CM | POA: Diagnosis not present

## 2022-02-09 DIAGNOSIS — I998 Other disorder of circulatory system: Secondary | ICD-10-CM
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
## 2022-02-13 ENCOUNTER — Ambulatory Visit: Payer: Medicare Other

## 2022-02-13 DIAGNOSIS — M6281 Muscle weakness (generalized): Secondary | ICD-10-CM | POA: Diagnosis not present

## 2022-02-13 DIAGNOSIS — R278 Other lack of coordination: Secondary | ICD-10-CM

## 2022-02-13 DIAGNOSIS — I998 Other disorder of circulatory system: Secondary | ICD-10-CM
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
## 2022-02-15 ENCOUNTER — Ambulatory Visit: Payer: Medicare Other

## 2022-02-15 DIAGNOSIS — M6281 Muscle weakness (generalized): Secondary | ICD-10-CM | POA: Diagnosis not present

## 2022-02-15 DIAGNOSIS — I998 Other disorder of circulatory system: Secondary | ICD-10-CM
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
## 2022-02-20 ENCOUNTER — Ambulatory Visit: Payer: Medicare Other

## 2022-02-20 DIAGNOSIS — M6281 Muscle weakness (generalized): Secondary | ICD-10-CM | POA: Diagnosis not present

## 2022-02-20 DIAGNOSIS — I998 Other disorder of circulatory system: Secondary | ICD-10-CM
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
## 2022-02-22 ENCOUNTER — Ambulatory Visit: Payer: Medicare Other

## 2022-02-22 DIAGNOSIS — I998 Other disorder of circulatory system: Secondary | ICD-10-CM

## 2022-02-22 DIAGNOSIS — M6281 Muscle weakness (generalized): Secondary | ICD-10-CM
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
## 2022-02-27 ENCOUNTER — Ambulatory Visit: Payer: Medicare Other

## 2022-02-27 DIAGNOSIS — M6281 Muscle weakness (generalized): Secondary | ICD-10-CM

## 2022-02-27 DIAGNOSIS — I998 Other disorder of circulatory system: Secondary | ICD-10-CM

## 2022-02-28 ENCOUNTER — Ambulatory Visit: Payer: Medicare Other
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
## 2022-03-01 ENCOUNTER — Ambulatory Visit: Payer: Medicare Other

## 2022-03-01 DIAGNOSIS — I998 Other disorder of circulatory system: Secondary | ICD-10-CM

## 2022-03-01 DIAGNOSIS — M6281 Muscle weakness (generalized): Secondary | ICD-10-CM | POA: Diagnosis not present
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
## 2022-03-06 ENCOUNTER — Ambulatory Visit: Payer: Medicare Other | Attending: Physician Assistant

## 2022-03-06 DIAGNOSIS — R278 Other lack of coordination: Secondary | ICD-10-CM | POA: Diagnosis present

## 2022-03-06 DIAGNOSIS — I998 Other disorder of circulatory system: Secondary | ICD-10-CM | POA: Insufficient documentation

## 2022-03-06 DIAGNOSIS — Z9889 Other specified postprocedural states: Secondary | ICD-10-CM | POA: Diagnosis present

## 2022-03-06 DIAGNOSIS — M6281 Muscle weakness (generalized): Secondary | ICD-10-CM | POA: Diagnosis not present
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
## 2022-03-09 ENCOUNTER — Ambulatory Visit: Payer: Medicare Other

## 2022-03-09 DIAGNOSIS — M6281 Muscle weakness (generalized): Secondary | ICD-10-CM

## 2022-03-09 DIAGNOSIS — I998 Other disorder of circulatory system: Secondary | ICD-10-CM

## 2022-03-09 DIAGNOSIS — Z9889 Other specified postprocedural states: Secondary | ICD-10-CM
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
## 2022-03-14 ENCOUNTER — Ambulatory Visit: Payer: Medicare Other

## 2022-03-14 ENCOUNTER — Telehealth: Payer: Self-pay | Admitting: Cardiovascular Disease

## 2022-03-14 DIAGNOSIS — M6281 Muscle weakness (generalized): Secondary | ICD-10-CM | POA: Diagnosis not present

## 2022-03-14 DIAGNOSIS — Z9889 Other specified postprocedural states: Secondary | ICD-10-CM

## 2022-03-14 NOTE — Telephone Encounter (Signed)
Lmov to schedule lexi and fu visit ordered in January .  Patient cancelled and never rescheduled.  ?
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
## 2022-03-16 ENCOUNTER — Ambulatory Visit: Payer: Medicare Other

## 2022-03-16 DIAGNOSIS — M6281 Muscle weakness (generalized): Secondary | ICD-10-CM | POA: Diagnosis not present

## 2022-03-16 DIAGNOSIS — Z9889 Other specified postprocedural states: Secondary | ICD-10-CM
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
## 2022-03-20 ENCOUNTER — Ambulatory Visit: Payer: Medicare Other

## 2022-03-20 DIAGNOSIS — M6281 Muscle weakness (generalized): Secondary | ICD-10-CM

## 2022-03-20 DIAGNOSIS — Z9889 Other specified postprocedural states: Secondary | ICD-10-CM

## 2022-03-20 DIAGNOSIS — I998 Other disorder of circulatory system: Secondary | ICD-10-CM
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
## 2022-03-23 ENCOUNTER — Ambulatory Visit: Payer: Medicare Other

## 2022-03-23 DIAGNOSIS — M6281 Muscle weakness (generalized): Secondary | ICD-10-CM | POA: Diagnosis not present

## 2022-03-23 DIAGNOSIS — R278 Other lack of coordination: Secondary | ICD-10-CM
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
## 2022-03-27 ENCOUNTER — Ambulatory Visit: Payer: Medicare Other

## 2022-03-27 DIAGNOSIS — M6281 Muscle weakness (generalized): Secondary | ICD-10-CM

## 2022-03-27 DIAGNOSIS — Z9889 Other specified postprocedural states: Secondary | ICD-10-CM

## 2022-03-27 DIAGNOSIS — I998 Other disorder of circulatory system: Secondary | ICD-10-CM
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
## 2022-03-30 ENCOUNTER — Ambulatory Visit: Payer: Medicare Other

## 2022-04-03 ENCOUNTER — Ambulatory Visit: Payer: Medicare Other

## 2022-04-03 DIAGNOSIS — I998 Other disorder of circulatory system: Secondary | ICD-10-CM

## 2022-04-03 DIAGNOSIS — M6281 Muscle weakness (generalized): Secondary | ICD-10-CM

## 2022-04-03 DIAGNOSIS — Z9889 Other specified postprocedural states: Secondary | ICD-10-CM
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
## 2022-04-06 ENCOUNTER — Ambulatory Visit: Payer: Medicare Other | Attending: Physician Assistant

## 2022-04-10 ENCOUNTER — Ambulatory Visit: Payer: Medicare Other

## 2022-04-17 ENCOUNTER — Ambulatory Visit: Payer: Medicare Other

## 2022-04-20 ENCOUNTER — Ambulatory Visit: Payer: Medicare Other

## 2022-05-10 ENCOUNTER — Ambulatory Visit: Payer: Medicare Other | Attending: Physician Assistant

## 2022-05-10 DIAGNOSIS — I998 Other disorder of circulatory system: Secondary | ICD-10-CM | POA: Diagnosis present

## 2022-05-10 DIAGNOSIS — M6281 Muscle weakness (generalized): Secondary | ICD-10-CM | POA: Insufficient documentation

## 2022-05-10 DIAGNOSIS — Z9889 Other specified postprocedural states: Secondary | ICD-10-CM | POA: Diagnosis not present
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
## 2022-06-07 NOTE — Telephone Encounter (Signed)
Patient declined fu appts .  He is seeing pcp at this time .

## 2022-11-16 DIAGNOSIS — H0100A Unspecified blepharitis right eye, upper and lower eyelids: Secondary | ICD-10-CM | POA: Diagnosis not present

## 2022-11-16 DIAGNOSIS — H0100B Unspecified blepharitis left eye, upper and lower eyelids: Secondary | ICD-10-CM | POA: Diagnosis not present

## 2022-11-19 DIAGNOSIS — Z139 Encounter for screening, unspecified: Secondary | ICD-10-CM | POA: Diagnosis not present

## 2022-11-19 DIAGNOSIS — E7800 Pure hypercholesterolemia, unspecified: Secondary | ICD-10-CM | POA: Diagnosis not present

## 2022-11-19 DIAGNOSIS — Z86718 Personal history of other venous thrombosis and embolism: Secondary | ICD-10-CM | POA: Diagnosis not present

## 2022-11-19 DIAGNOSIS — I10 Essential (primary) hypertension: Secondary | ICD-10-CM | POA: Diagnosis not present

## 2022-11-19 DIAGNOSIS — I493 Ventricular premature depolarization: Secondary | ICD-10-CM | POA: Diagnosis not present

## 2022-11-19 DIAGNOSIS — E119 Type 2 diabetes mellitus without complications: Secondary | ICD-10-CM | POA: Diagnosis not present

## 2022-11-19 DIAGNOSIS — I77810 Thoracic aortic ectasia: Secondary | ICD-10-CM | POA: Diagnosis not present

## 2022-11-19 DIAGNOSIS — I509 Heart failure, unspecified: Secondary | ICD-10-CM | POA: Diagnosis not present

## 2022-12-24 ENCOUNTER — Ambulatory Visit: Payer: Medicare Other | Admitting: Urology

## 2022-12-24 ENCOUNTER — Encounter: Payer: Self-pay | Admitting: Urology

## 2022-12-24 VITALS — BP 173/95 | HR 80 | Ht 70.0 in | Wt 240.0 lb

## 2022-12-24 DIAGNOSIS — N5201 Erectile dysfunction due to arterial insufficiency: Secondary | ICD-10-CM

## 2022-12-24 MED ORDER — AMBULATORY NON FORMULARY MEDICATION: 0 refills | Status: DC
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
## 2022-12-25 ENCOUNTER — Encounter: Payer: Self-pay | Admitting: Urology

## 2023-01-30 ENCOUNTER — Telehealth: Payer: Self-pay | Admitting: Urology

## 2023-01-30 DIAGNOSIS — N5201 Erectile dysfunction due to arterial insufficiency: Secondary | ICD-10-CM

## 2023-01-30 NOTE — Telephone Encounter (Signed)
Pt stopped by office to let us know he would like to proceed with his referral.  Please call and let him know where we will refer him to and which dr.

## 2023-01-31 NOTE — Addendum Note (Signed)
Addended by: Abbie Sons on: 01/31/2023 08:44 AM   Modules accepted: Orders

## 2023-01-31 NOTE — Telephone Encounter (Signed)
  [facility] [place].  He will be able to get a quicker appointment there.  If he desires [facility] let me know and we will change   Left patient a message about the referral . Referral was sent.

## 2023-02-19 DIAGNOSIS — Z86718 Personal history of other venous thrombosis and embolism: Secondary | ICD-10-CM | POA: Diagnosis not present

## 2023-02-19 DIAGNOSIS — E7800 Pure hypercholesterolemia, unspecified: Secondary | ICD-10-CM | POA: Diagnosis not present

## 2023-02-19 DIAGNOSIS — M792 Neuralgia and neuritis, unspecified: Secondary | ICD-10-CM | POA: Diagnosis not present

## 2023-02-19 DIAGNOSIS — E119 Type 2 diabetes mellitus without complications: Secondary | ICD-10-CM | POA: Diagnosis not present

## 2023-02-19 DIAGNOSIS — I493 Ventricular premature depolarization: Secondary | ICD-10-CM | POA: Diagnosis not present

## 2023-02-19 DIAGNOSIS — I509 Heart failure, unspecified: Secondary | ICD-10-CM | POA: Diagnosis not present

## 2023-02-19 DIAGNOSIS — Z9181 History of falling: Secondary | ICD-10-CM | POA: Diagnosis not present

## 2023-02-19 DIAGNOSIS — I77810 Thoracic aortic ectasia: Secondary | ICD-10-CM | POA: Diagnosis not present

## 2023-02-19 DIAGNOSIS — I10 Essential (primary) hypertension: Secondary | ICD-10-CM | POA: Diagnosis not present

## 2023-03-29 DIAGNOSIS — I341 Nonrheumatic mitral (valve) prolapse: Secondary | ICD-10-CM | POA: Diagnosis not present

## 2023-03-29 DIAGNOSIS — I159 Secondary hypertension, unspecified: Secondary | ICD-10-CM | POA: Diagnosis not present

## 2023-03-29 DIAGNOSIS — I77810 Thoracic aortic ectasia: Secondary | ICD-10-CM | POA: Diagnosis not present

## 2023-03-29 DIAGNOSIS — I742 Embolism and thrombosis of arteries of the upper extremities: Secondary | ICD-10-CM | POA: Diagnosis not present

## 2023-03-29 DIAGNOSIS — I420 Dilated cardiomyopathy: Secondary | ICD-10-CM | POA: Diagnosis not present

## 2023-04-10 ENCOUNTER — Other Ambulatory Visit: Payer: Self-pay | Admitting: Urology

## 2023-04-10 DIAGNOSIS — G250 Essential tremor: Secondary | ICD-10-CM | POA: Diagnosis not present

## 2023-04-10 DIAGNOSIS — M792 Neuralgia and neuritis, unspecified: Secondary | ICD-10-CM | POA: Diagnosis not present

## 2023-04-17 DIAGNOSIS — I341 Nonrheumatic mitral (valve) prolapse: Secondary | ICD-10-CM | POA: Diagnosis not present

## 2023-04-17 DIAGNOSIS — I420 Dilated cardiomyopathy: Secondary | ICD-10-CM | POA: Diagnosis not present

## 2023-05-20 NOTE — Progress Notes (Addendum)
Anesthesia Review:  PCP:  Lonie Peak  Cardiologist :Paraschos LOV 03/29/23  Chest x-ray : EKG : 05/23/23 Jacobo Forest aware of EKG result Echo : 04/17/23  Monitor- 2022  Stress test: Cardiac Cath :  Activity level: can do a flgiht of stairs without difficutly  Sleep Study/ CPAP :  no cpap  Fasting Blood Sugar :      / Checks Blood Sugar -- times a day:   Blood Thinner/ Instructions /Last Dose: ASA / Instructions/ Last Dose :   DM- type 2 on no meds checks glucose every other day at home  Hgba1c- 7.7 - routed to DR [facility]

## 2023-05-21 NOTE — Patient Instructions (Addendum)
SURGICAL WAITING ROOM VISITATION  Patients having surgery or a procedure may have no more than 2 support people in the waiting area - these visitors may rotate.    Children under the age of 86 must have an adult with them who is not the patient.  Due to an increase in RSV and influenza rates and associated hospitalizations, children ages 33 and under may not visit patients in [facility] hospitals.  If the patient needs to stay at the hospital during part of their recovery, the visitor guidelines for inpatient rooms apply. Pre-op nurse will coordinate an appropriate time for 1 support person to accompany patient in pre-op.  This support person may not rotate.    Please refer to the [facility] website for the visitor guidelines for Inpatients (after your surgery is over and you are in a regular room).       Your procedure is scheduled on:  06/04/2023    Report to [facility] Main Entrance    Report to admitting at   [phone]   Call this number if you have problems the morning of surgery [phone]   Do not eat food oor drink liquids  :After Midnight.                              If you have questions, please contact your surgeon's office.       Oral Hygiene is also important to reduce your risk of infection.                                    Remember - BRUSH YOUR TEETH THE MORNING OF SURGERY WITH YOUR REGULAR TOOTHPASTE  DENTURES WILL BE REMOVED PRIOR TO SURGERY PLEASE DO NOT APPLY "Poly grip" OR ADHESIVES!!!   Do NOT smoke after Midnight   Take these medicines the morning of surgery with A SIP OF WATER:  toprol   DO NOT TAKE ANY ORAL DIABETIC MEDICATIONS DAY OF YOUR SURGERY  Bring CPAP mask and tubing day of surgery.                              You may not have any metal on your body including hair pins, jewelry, and body piercing             Do not wear make-up, lotions, powders, perfumes/cologne, or deodorant  Do not wear nail polish including gel and  S&S, artificial/acrylic nails, or any other type of covering on natural nails including finger and toenails. If you have artificial nails, gel coating, etc. that needs to be removed by a nail salon please have this removed prior to surgery or surgery may need to be canceled/ delayed if the surgeon/ anesthesia feels like they are unable to be safely monitored.   Do not shave  48 hours prior to surgery.               Men may shave face and neck.   Do not bring valuables to the hospital. [facility] IS NOT             RESPONSIBLE   FOR VALUABLES.   Contacts, glasses, dentures or bridgework may not be worn into surgery.   Bring small overnight bag day of surgery.   DO NOT BRING YOUR HOME MEDICATIONS TO THE HOSPITAL.  PHARMACY WILL DISPENSE MEDICATIONS LISTED ON YOUR MEDICATION LIST TO YOU DURING YOUR ADMISSION IN THE HOSPITAL!    Patients discharged on the day of surgery will not be allowed to drive home.  Someone NEEDS to stay with you for the first 24 hours after anesthesia.   Special Instructions: Bring a copy of your healthcare power of attorney and living will documents the day of surgery if you haven't scanned them before.              Please read over the following fact sheets you were given: IF YOU HAVE QUESTIONS ABOUT YOUR PRE-OP INSTRUCTIONS PLEASE CALL [phone]   If you received a COVID test during your pre-op visit  it is requested that you wear a mask when out in public, stay away from anyone that may not be feeling well and notify your surgeon if you develop symptoms. If you test positive for Covid or have been in contact with anyone that has tested positive in the last 10 days please notify you surgeon.  [facility] - Preparing for Surgery Before surgery, you can play an important role.  Because skin is not sterile, your skin needs to be as free of germs as possible.  You can reduce the number of germs on your skin by washing with CHG (chlorahexidine gluconate) soap before  surgery.  CHG is an antiseptic cleaner which kills germs and bonds with the skin to continue killing germs even after washing. Please DO NOT use if you have an allergy to CHG or antibacterial soaps.  If your skin becomes reddened/irritated stop using the CHG and inform your nurse when you arrive at Short Stay. Do not shave (including legs and underarms) for at least 48 hours prior to the first CHG shower.  You may shave your face/neck. Please follow these instructions carefully:  1.  Shower with CHG Soap the night before surgery and the  morning of Surgery.  2.  If you choose to wash your hair, wash your hair first as usual with your  normal  shampoo.  3.  After you shampoo, rinse your hair and body thoroughly to remove the  shampoo.                           4.  Use CHG as you would any other liquid soap.  You can apply chg directly  to the skin and wash                       Gently with a scrungie or clean washcloth.  5.  Apply the CHG Soap to your body ONLY FROM THE NECK DOWN.   Do not use on face/ open                           Wound or open sores. Avoid contact with eyes, ears mouth and genitals (private parts).                       Wash face,  Genitals (private parts) with your normal soap.             6.  Wash thoroughly, paying special attention to the area where your surgery  will be performed.  7.  Thoroughly rinse your body with warm water from the neck down.  8.  DO NOT shower/wash with your normal soap after using and  rinsing off  the CHG Soap.                9.  Pat yourself dry with a clean towel.            10.  Wear clean pajamas.            11.  Place clean sheets on your bed the night of your first shower and do not  sleep with pets. Day of Surgery : Do not apply any lotions/deodorants the morning of surgery.  Please wear clean clothes to the hospital/surgery center.  FAILURE TO FOLLOW THESE INSTRUCTIONS MAY RESULT IN THE CANCELLATION OF YOUR SURGERY PATIENT  SIGNATURE_________________________________  NURSE SIGNATURE__________________________________  ________________________________________________________________________

## 2023-05-23 ENCOUNTER — Encounter (HOSPITAL_COMMUNITY)
Admission: RE | Admit: 2023-05-23 | Discharge: 2023-05-23 | Disposition: A | Discharge status: Home / Self Care | Payer: Medicare Other | Source: Ambulatory Visit | Attending: Urology | Admitting: Urology

## 2023-05-23 ENCOUNTER — Encounter (HOSPITAL_COMMUNITY): Payer: Self-pay

## 2023-05-23 ENCOUNTER — Other Ambulatory Visit: Payer: Self-pay

## 2023-05-23 VITALS — BP 151/80 | HR 63 | Temp 98.8°F | Resp 16 | Ht 68.0 in

## 2023-05-23 DIAGNOSIS — N529 Male erectile dysfunction, unspecified: Secondary | ICD-10-CM | POA: Diagnosis not present

## 2023-05-23 DIAGNOSIS — Z112 Encounter for screening for other bacterial diseases: Secondary | ICD-10-CM | POA: Insufficient documentation

## 2023-05-23 DIAGNOSIS — I10 Essential (primary) hypertension: Secondary | ICD-10-CM | POA: Diagnosis not present

## 2023-05-23 DIAGNOSIS — Z01818 Encounter for other preprocedural examination: Secondary | ICD-10-CM | POA: Insufficient documentation

## 2023-05-23 DIAGNOSIS — N390 Urinary tract infection, site not specified: Secondary | ICD-10-CM | POA: Diagnosis not present

## 2023-05-23 DIAGNOSIS — I77819 Aortic ectasia, unspecified site: Secondary | ICD-10-CM | POA: Insufficient documentation

## 2023-05-23 DIAGNOSIS — B9689 Other specified bacterial agents as the cause of diseases classified elsewhere: Secondary | ICD-10-CM | POA: Insufficient documentation

## 2023-05-23 DIAGNOSIS — E119 Type 2 diabetes mellitus without complications: Secondary | ICD-10-CM | POA: Insufficient documentation

## 2023-05-23 DIAGNOSIS — I083 Combined rheumatic disorders of mitral, aortic and tricuspid valves: Secondary | ICD-10-CM | POA: Diagnosis not present

## 2023-05-23 DIAGNOSIS — I420 Dilated cardiomyopathy: Secondary | ICD-10-CM | POA: Insufficient documentation

## 2023-05-23 HISTORY — DX: Pneumonia, unspecified organism: J18.9

## 2023-05-23 HISTORY — DX: Sleep apnea, unspecified: G47.30

## 2023-05-23 HISTORY — DX: Ventricular premature depolarization: I49.3

## 2023-05-23 HISTORY — DX: Type 2 diabetes mellitus without complications: E11.9

## 2023-05-23 HISTORY — DX: Nonrheumatic mitral (valve) prolapse: I34.1

## 2023-05-23 HISTORY — DX: Unspecified osteoarthritis, unspecified site: M19.90

## 2023-05-23 LAB — CBC
HCT: 49.1 % (ref 39.0–52.0)
Hemoglobin: 16.4 g/dL (ref 13.0–17.0)
MCH: 32.2 pg (ref 26.0–34.0)
MCHC: 33.4 g/dL (ref 30.0–36.0)
MCV: 96.3 fL (ref 80.0–100.0)
Platelets: 143 10*3/uL — ABNORMAL LOW (ref 150–400)
RBC: 5.1 MIL/uL (ref 4.22–5.81)
RDW: 11.9 % (ref 11.5–15.5)
WBC: 5 10*3/uL (ref 4.0–10.5)
nRBC: 0 % (ref 0.0–0.2)

## 2023-05-23 LAB — BASIC METABOLIC PANEL
Anion gap: 9 (ref 5–15)
BUN: 19 mg/dL (ref 8–23)
CO2: 24 mmol/L (ref 22–32)
Calcium: 8.9 mg/dL (ref 8.9–10.3)
Chloride: 103 mmol/L (ref 98–111)
Creatinine, Ser: 0.94 mg/dL (ref 0.61–1.24)
GFR, Estimated: >60 mL/min (ref >60)
Glucose, Bld: 206 mg/dL — ABNORMAL HIGH (ref 70–99)
Potassium: 4.4 mmol/L (ref 3.5–5.1)
Sodium: 136 mmol/L (ref 135–145)

## 2023-05-23 LAB — GLUCOSE, CAPILLARY: Glucose-Capillary: 199 mg/dL — ABNORMAL HIGH (ref 70–99)

## 2023-05-24 LAB — HEMOGLOBIN A1C
Hgb A1c MFr Bld: 7.7 % — ABNORMAL HIGH (ref 4.8–5.6)
Mean Plasma Glucose: 174 mg/dL

## 2023-05-24 LAB — URINE CULTURE: Culture: NO GROWTH

## 2023-05-28 DIAGNOSIS — I509 Heart failure, unspecified: Secondary | ICD-10-CM | POA: Diagnosis not present

## 2023-05-28 DIAGNOSIS — Z79899 Other long term (current) drug therapy: Secondary | ICD-10-CM | POA: Diagnosis not present

## 2023-05-28 DIAGNOSIS — Z86718 Personal history of other venous thrombosis and embolism: Secondary | ICD-10-CM | POA: Diagnosis not present

## 2023-05-28 DIAGNOSIS — E119 Type 2 diabetes mellitus without complications: Secondary | ICD-10-CM | POA: Diagnosis not present

## 2023-05-28 DIAGNOSIS — I10 Essential (primary) hypertension: Secondary | ICD-10-CM | POA: Diagnosis not present

## 2023-05-28 DIAGNOSIS — I77810 Thoracic aortic ectasia: Secondary | ICD-10-CM | POA: Diagnosis not present

## 2023-05-28 DIAGNOSIS — E7800 Pure hypercholesterolemia, unspecified: Secondary | ICD-10-CM | POA: Diagnosis not present

## 2023-05-28 DIAGNOSIS — M792 Neuralgia and neuritis, unspecified: Secondary | ICD-10-CM | POA: Diagnosis not present

## 2023-05-28 DIAGNOSIS — I493 Ventricular premature depolarization: Secondary | ICD-10-CM | POA: Diagnosis not present
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
## 2023-05-31 NOTE — Anesthesia Preprocedure Evaluation (Signed)
Anesthesia Evaluation  Patient identified by MRN, date of birth, ID band Patient awake    Reviewed: Allergy & Precautions, H&P , NPO status , Patient's Chart, lab work & pertinent test results  Airway Mallampati: II  TM Distance: >3 FB Neck ROM: Full    Dental no notable dental hx.    Pulmonary sleep apnea    Pulmonary exam normal breath sounds clear to auscultation       Cardiovascular hypertension, + Peripheral Vascular Disease and +CHF  Normal cardiovascular exam Rhythm:Regular Rate:Normal  Left Ventricle: Left ventricular ejection fraction, by estimation, is 30  to 35%. The left ventricle has moderately decreased function. The left  ventricle demonstrates global hypokinesis. The left ventricular internal  cavity size was normal in size.  There is moderate left ventricular hypertrophy. Left ventricular diastolic  parameters are consistent with Grade I diastolic dysfunction (impaired  relaxation).   Right Ventricle: The right ventricular size is normal. No increase in  right ventricular wall thickness. Right ventricular systolic function is  normal.   Left Atrium: Left atrial size was normal in size.   Right Atrium: Right atrial size was normal in size.   Pericardium: There is no evidence of pericardial effusion.   Mitral Valve: The mitral valve is normal in structure. Mild mitral valve  regurgitation. No evidence of mitral valve stenosis. MV peak gradient, 2.8  mmHg. The mean mitral valve gradient is 1.0 mmHg.   Tricuspid Valve: The tricuspid valve is normal in structure. Tricuspid  valve regurgitation is mild . No evidence of tricuspid stenosis.   Aortic Valve: The aortic valve is normal in structure. Aortic valve  regurgitation is not visualized. No aortic stenosis is present. Aortic  valve mean gradient measures 1.0 mmHg. Aortic valve peak gradient measures  2.2 mmHg. Aortic valve area, by VTI  measures 4.32 cm.    Pulmonic Valve: The pulmonic valve was normal in structure. Pulmonic valve  regurgitation is not visualized. No evidence of pulmonic stenosis.   Aorta: The aortic root is normal in size and structure. There is mild  dilatation of the ascending aorta and of the aortic root, measuring 44 mm     Neuro/Psych negative neurological ROS  negative psych ROS   GI/Hepatic negative GI ROS, Neg liver ROS,,,  Endo/Other  diabetes, Type 2    Renal/GU negative Renal ROS  negative genitourinary   Musculoskeletal negative musculoskeletal ROS (+)    Abdominal   Peds negative pediatric ROS (+)  Hematology negative hematology ROS (+)   Anesthesia Other Findings   Reproductive/Obstetrics negative OB ROS                             Anesthesia Physical Anesthesia Plan  ASA: 3  Anesthesia Plan: General   Post-op Pain Management:    Induction: Intravenous  PONV Risk Score and Plan: 2 and Ondansetron, Dexamethasone and Treatment may vary due to age or medical condition  Airway Management Planned: Oral ETT  Additional Equipment:   Intra-op Plan:   Post-operative Plan: Extubation in OR  Informed Consent: I have reviewed the patients History and Physical, chart, labs and discussed the procedure including the risks, benefits and alternatives for the proposed anesthesia with the patient or authorized representative who has indicated his/her understanding and acceptance.     Dental advisory given  Plan Discussed with: CRNA and Surgeon  Anesthesia Plan Comments: (See PAT note 05/23/2023)       Anesthesia Quick  Evaluation

## 2023-06-04 ENCOUNTER — Ambulatory Visit (HOSPITAL_BASED_OUTPATIENT_CLINIC_OR_DEPARTMENT_OTHER): Payer: Medicare Other | Admitting: Anesthesiology

## 2023-06-04 ENCOUNTER — Encounter (HOSPITAL_COMMUNITY): Payer: Self-pay | Admitting: Urology

## 2023-06-04 ENCOUNTER — Encounter (HOSPITAL_COMMUNITY)
Admission: RE | Disposition: A | Discharge status: Home / Self Care | Payer: Self-pay | Source: Home / Self Care | Attending: Urology

## 2023-06-04 ENCOUNTER — Ambulatory Visit (HOSPITAL_COMMUNITY)
Admission: RE | Admit: 2023-06-04 | Discharge: 2023-06-04 | Disposition: A | Discharge status: Home / Self Care | Payer: Medicare Other | Attending: Urology | Admitting: Urology

## 2023-06-04 ENCOUNTER — Other Ambulatory Visit: Payer: Self-pay

## 2023-06-04 ENCOUNTER — Ambulatory Visit (HOSPITAL_COMMUNITY): Payer: Medicare Other | Admitting: Physician Assistant

## 2023-06-04 DIAGNOSIS — I509 Heart failure, unspecified: Secondary | ICD-10-CM | POA: Insufficient documentation

## 2023-06-04 DIAGNOSIS — I5020 Unspecified systolic (congestive) heart failure: Secondary | ICD-10-CM | POA: Diagnosis not present

## 2023-06-04 DIAGNOSIS — G4730 Sleep apnea, unspecified: Secondary | ICD-10-CM | POA: Insufficient documentation

## 2023-06-04 DIAGNOSIS — N529 Male erectile dysfunction, unspecified: Secondary | ICD-10-CM

## 2023-06-04 DIAGNOSIS — Z833 Family history of diabetes mellitus: Secondary | ICD-10-CM | POA: Insufficient documentation

## 2023-06-04 DIAGNOSIS — E1151 Type 2 diabetes mellitus with diabetic peripheral angiopathy without gangrene: Secondary | ICD-10-CM | POA: Diagnosis not present

## 2023-06-04 DIAGNOSIS — Z8249 Family history of ischemic heart disease and other diseases of the circulatory system: Secondary | ICD-10-CM | POA: Diagnosis not present

## 2023-06-04 DIAGNOSIS — I110 Hypertensive heart disease with heart failure: Secondary | ICD-10-CM | POA: Insufficient documentation

## 2023-06-04 DIAGNOSIS — F418 Other specified anxiety disorders: Secondary | ICD-10-CM

## 2023-06-04 DIAGNOSIS — E785 Hyperlipidemia, unspecified: Secondary | ICD-10-CM | POA: Diagnosis not present

## 2023-06-04 DIAGNOSIS — Z01818 Encounter for other preprocedural examination: Secondary | ICD-10-CM

## 2023-06-04 HISTORY — PX: PENILE PROSTHESIS IMPLANT: SHX240

## 2023-06-04 LAB — GLUCOSE, CAPILLARY
Glucose-Capillary: 198 mg/dL — ABNORMAL HIGH (ref 70–99)
Glucose-Capillary: 181 mg/dL — ABNORMAL HIGH (ref 70–99)

## 2023-06-04 SURGERY — INSERTION, PENILE PROSTHESIS, INFLATABLE
Anesthesia: General

## 2023-06-04 MED ORDER — CELECOXIB 200 MG PO CAPS: 200.0000 mg | ORAL_CAPSULE | Freq: Two times a day (BID) | ORAL | 1 refills | Status: AC

## 2023-06-04 MED ORDER — FLUCONAZOLE IN SODIUM CHLORIDE 200-0.9 MG/100ML-% IV SOLN
200.0000 mg | INTRAVENOUS | Status: DC
  Administered 2023-06-04: 200 mg via INTRAVENOUS
  Filled 2023-06-04: qty 100

## 2023-06-04 MED ORDER — VANCOMYCIN HCL 1500 MG/300ML IV SOLN
1500.0000 mg | INTRAVENOUS | Status: AC
  Administered 2023-06-04: 1500 mg via INTRAVENOUS
  Filled 2023-06-04: qty 300

## 2023-06-04 MED ORDER — LIDOCAINE HCL (PF) 1 % IJ SOLN
INTRAMUSCULAR | Status: AC
  Filled 2023-06-04: qty 30

## 2023-06-04 MED ORDER — OXYCODONE HCL 5 MG PO TABS
ORAL_TABLET | ORAL | Status: AC
  Filled 2023-06-04: qty 1

## 2023-06-04 MED ORDER — PHENYLEPHRINE HCL (PRESSORS) 10 MG/ML IV SOLN
INTRAVENOUS | Status: DC | PRN
  Administered 2023-06-04 (×2): 100 ug via INTRAVENOUS

## 2023-06-04 MED ORDER — GENTAMICIN SULFATE 40 MG/ML IJ SOLN
400.0000 mg | INTRAVENOUS | Status: AC
  Administered 2023-06-04: 400 mg via INTRAVENOUS
  Filled 2023-06-04: qty 10

## 2023-06-04 MED ORDER — LIDOCAINE HCL (CARDIAC) PF 100 MG/5ML IV SOSY
PREFILLED_SYRINGE | INTRAVENOUS | Status: DC | PRN
  Administered 2023-06-04: 50 mg via INTRAVENOUS

## 2023-06-04 MED ORDER — IRRISEPT - 450ML BOTTLE WITH 0.05% CHG IN STERILE WATER, USP 99.95% OPTIME
TOPICAL | Status: DC | PRN
  Administered 2023-06-04: 900 mL

## 2023-06-04 MED ORDER — ACETAMINOPHEN 10 MG/ML IV SOLN
INTRAVENOUS | Status: AC
  Filled 2023-06-04: qty 100

## 2023-06-04 MED ORDER — FENTANYL CITRATE (PF) 100 MCG/2ML IJ SOLN
INTRAMUSCULAR | Status: AC
  Filled 2023-06-04: qty 2

## 2023-06-04 MED ORDER — FENTANYL CITRATE (PF) 100 MCG/2ML IJ SOLN
INTRAMUSCULAR | Status: DC | PRN
  Administered 2023-06-04 (×2): 50 ug via INTRAVENOUS

## 2023-06-04 MED ORDER — LACTATED RINGERS IV SOLN: INTRAVENOUS | Status: DC

## 2023-06-04 MED ORDER — ONDANSETRON HCL 4 MG/2ML IJ SOLN
INTRAMUSCULAR | Status: DC | PRN
  Administered 2023-06-04: 4 mg via INTRAVENOUS

## 2023-06-04 MED ORDER — CHLORHEXIDINE GLUCONATE 4 % EX SOLN: Freq: Once | CUTANEOUS | Status: DC

## 2023-06-04 MED ORDER — ONDANSETRON HCL 4 MG/2ML IJ SOLN: 4.0000 mg | Freq: Once | INTRAMUSCULAR | Status: DC | PRN

## 2023-06-04 MED ORDER — ACETAMINOPHEN 500 MG PO TABS: 1000.0000 mg | ORAL_TABLET | Freq: Four times a day (QID) | ORAL | 0 refills | Status: AC

## 2023-06-04 MED ORDER — HYDROMORPHONE HCL 1 MG/ML IJ SOLN
INTRAMUSCULAR | Status: AC
  Filled 2023-06-04: qty 1

## 2023-06-04 MED ORDER — ORAL CARE MOUTH RINSE: 15.0000 mL | Freq: Once | OROMUCOSAL | Status: AC

## 2023-06-04 MED ORDER — SUGAMMADEX SODIUM 200 MG/2ML IV SOLN
INTRAVENOUS | Status: DC | PRN
  Administered 2023-06-04: 180 mg via INTRAVENOUS

## 2023-06-04 MED ORDER — HYDROMORPHONE HCL 1 MG/ML IJ SOLN
0.2500 mg | INTRAMUSCULAR | Status: DC | PRN
  Administered 2023-06-04: 0.5 mg via INTRAVENOUS
  Administered 2023-06-04 (×2): 0.25 mg via INTRAVENOUS

## 2023-06-04 MED ORDER — STERILE WATER FOR IRRIGATION IR SOLN
Status: DC | PRN
  Administered 2023-06-04: 1000 mL

## 2023-06-04 MED ORDER — ROCURONIUM BROMIDE 10 MG/ML (PF) SYRINGE
PREFILLED_SYRINGE | INTRAVENOUS | Status: AC
  Filled 2023-06-04: qty 10

## 2023-06-04 MED ORDER — MUPIROCIN 2 % EX OINT
1.0000 | TOPICAL_OINTMENT | Freq: Once | CUTANEOUS | Status: DC
  Filled 2023-06-04: qty 22

## 2023-06-04 MED ORDER — OXYCODONE HCL 5 MG PO TABS
5.0000 mg | ORAL_TABLET | Freq: Once | ORAL | Status: AC | PRN
  Administered 2023-06-04: 5 mg via ORAL

## 2023-06-04 MED ORDER — PHENYLEPHRINE HCL-NACL 20-0.9 MG/250ML-% IV SOLN
INTRAVENOUS | Status: AC
  Filled 2023-06-04: qty 250

## 2023-06-04 MED ORDER — ROCURONIUM BROMIDE 100 MG/10ML IV SOLN
INTRAVENOUS | Status: DC | PRN
  Administered 2023-06-04: 70 mg via INTRAVENOUS

## 2023-06-04 MED ORDER — PROPOFOL 10 MG/ML IV BOLUS
INTRAVENOUS | Status: DC | PRN
Start: 2023-06-04 — End: 2023-06-04
  Administered 2023-06-04: 200 mg via INTRAVENOUS

## 2023-06-04 MED ORDER — SODIUM CHLORIDE 0.9 % IR SOLN
Status: DC | PRN
  Administered 2023-06-04: 1000 mL

## 2023-06-04 MED ORDER — OXYCODONE HCL 5 MG/5ML PO SOLN: 5.0000 mg | Freq: Once | ORAL | Status: AC | PRN

## 2023-06-04 MED ORDER — MIDAZOLAM HCL 5 MG/5ML IJ SOLN
INTRAMUSCULAR | Status: DC | PRN
  Administered 2023-06-04: 2 mg via INTRAVENOUS

## 2023-06-04 MED ORDER — OXYCODONE HCL 10 MG PO TABS: 5.0000 mg | ORAL_TABLET | Freq: Four times a day (QID) | ORAL | 0 refills | Status: AC | PRN

## 2023-06-04 MED ORDER — LIDOCAINE HCL 1 % IJ SOLN
INTRAMUSCULAR | Status: DC | PRN
  Administered 2023-06-04: 10 mL

## 2023-06-04 MED ORDER — INSULIN ASPART 100 UNIT/ML IJ SOLN
0.0000 [IU] | INTRAMUSCULAR | Status: DC | PRN
  Administered 2023-06-04: 4 [IU] via SUBCUTANEOUS
  Filled 2023-06-04: qty 1

## 2023-06-04 MED ORDER — ACETAMINOPHEN 10 MG/ML IV SOLN
1000.0000 mg | Freq: Once | INTRAVENOUS | Status: DC | PRN
  Administered 2023-06-04: 1000 mg via INTRAVENOUS

## 2023-06-04 MED ORDER — BUPIVACAINE HCL (PF) 0.5 % IJ SOLN
INTRAMUSCULAR | Status: AC
  Filled 2023-06-04: qty 30

## 2023-06-04 MED ORDER — ONDANSETRON HCL 4 MG/2ML IJ SOLN
INTRAMUSCULAR | Status: AC
  Filled 2023-06-04: qty 2

## 2023-06-04 MED ORDER — SULFAMETHOXAZOLE-TRIMETHOPRIM 800-160 MG PO TABS: 1.0000 | ORAL_TABLET | Freq: Two times a day (BID) | ORAL | 0 refills | Status: AC

## 2023-06-04 MED ORDER — CHLORHEXIDINE GLUCONATE 0.12 % MT SOLN
15.0000 mL | Freq: Once | OROMUCOSAL | Status: AC
  Administered 2023-06-04: 15 mL via OROMUCOSAL

## 2023-06-04 MED ORDER — LIDOCAINE HCL (PF) 2 % IJ SOLN
INTRAMUSCULAR | Status: AC
  Filled 2023-06-04: qty 5

## 2023-06-04 MED ORDER — BUPIVACAINE HCL 0.5 % IJ SOLN
INTRAMUSCULAR | Status: DC | PRN
  Administered 2023-06-04: 10 mL

## 2023-06-04 MED ORDER — PHENYLEPHRINE 80 MCG/ML (10ML) SYRINGE FOR IV PUSH (FOR BLOOD PRESSURE SUPPORT)
PREFILLED_SYRINGE | INTRAVENOUS | Status: AC
  Filled 2023-06-04: qty 10

## 2023-06-04 MED ORDER — PROPOFOL 10 MG/ML IV BOLUS
INTRAVENOUS | Status: AC
  Filled 2023-06-04: qty 20

## 2023-06-04 MED ORDER — MIDAZOLAM HCL 2 MG/2ML IJ SOLN
INTRAMUSCULAR | Status: AC
  Filled 2023-06-04: qty 2

## 2023-06-04 SURGICAL SUPPLY — 56 items
ADH SKN CLS APL DERMABOND .7 (GAUZE/BANDAGES/DRESSINGS) ×1
APL PRP STRL LF DISP 70% ISPRP (MISCELLANEOUS) ×2
BAG DRN RND TRDRP ANRFLXCHMBR (UROLOGICAL SUPPLIES) ×1
BAG URINE DRAIN 2000ML AR STRL (UROLOGICAL SUPPLIES) ×1 IMPLANT
BLADE SURG 15 STRL LF DISP TIS (BLADE) IMPLANT
BLADE SURG 15 STRL SS (BLADE)
BNDG GAUZE DERMACEA FLUFF 4 (GAUZE/BANDAGES/DRESSINGS) ×1 IMPLANT
BNDG GZE DERMACEA 4 6PLY (GAUZE/BANDAGES/DRESSINGS) ×1
BRIEF MESH DISP LRG (UNDERPADS AND DIAPERS) ×1 IMPLANT
CATH COUDE 5CC RIBBED (CATHETERS) ×1 IMPLANT
CATH RIBBED COUDE 5CC (CATHETERS) ×1
CHLORAPREP W/TINT 26 (MISCELLANEOUS) ×2 IMPLANT
COVER MAYO STAND STRL (DRAPES) ×1 IMPLANT
COVER SURGICAL LIGHT HANDLE (MISCELLANEOUS) ×1 IMPLANT
DERMABOND ADVANCED .7 DNX12 (GAUZE/BANDAGES/DRESSINGS) ×1 IMPLANT
DRAIN CHANNEL 10F 3/8 F FF (DRAIN) IMPLANT
DRAPE INCISE IOBAN 66X45 STRL (DRAPES) ×1 IMPLANT
DRAPE LAPAROTOMY T 98X78 PEDS (DRAPES) ×1 IMPLANT
DRSG TEGADERM 4X4.75 (GAUZE/BANDAGES/DRESSINGS) ×1 IMPLANT
ELECT REM PT RETURN 15FT ADLT (MISCELLANEOUS) ×1 IMPLANT
EVACUATOR SILICONE 100CC (DRAIN) ×1 IMPLANT
GLOVE BIO SURGEON STRL SZ7 (GLOVE) ×1 IMPLANT
GLOVE BIOGEL PI IND STRL 7.0 (GLOVE) ×1 IMPLANT
GOWN STRL REUS W/ TWL LRG LVL3 (GOWN DISPOSABLE) ×1 IMPLANT
GOWN STRL REUS W/TWL LRG LVL3 (GOWN DISPOSABLE) ×1
HOLDER FOLEY CATH W/STRAP (MISCELLANEOUS) ×1 IMPLANT
IMPL RTE SNAPCONE 0.5CM (Urological Implant) IMPLANT
IMPLANT RTE SNAPCONE 0.5CM (Urological Implant) ×1 IMPLANT
JET LAVAGE IRRISEPT WOUND (IRRIGATION / IRRIGATOR) ×3
KIT ACCESSORY AMS 700 PUMP (Urological Implant) IMPLANT
KIT BASIN OR (CUSTOM PROCEDURE TRAY) ×1 IMPLANT
KIT TURNOVER KIT A (KITS) IMPLANT
LAVAGE JET IRRISEPT WOUND (IRRIGATION / IRRIGATOR) IMPLANT
NDL HYPO 22X1.5 SAFETY MO (MISCELLANEOUS) ×1 IMPLANT
NEEDLE HYPO 22X1.5 SAFETY MO (MISCELLANEOUS) ×1 IMPLANT
NS IRRIG 1000ML POUR BTL (IV SOLUTION) ×1 IMPLANT
PACK GENERAL/GYN (CUSTOM PROCEDURE TRAY) ×1 IMPLANT
PLUG CATH AND CAP STRL 200 (CATHETERS) ×1 IMPLANT
PROS PENILE AMS 700 CX 24 (Urological Implant) ×1 IMPLANT
PROSTHESIS PENIL AMS 700 CX 24 (Urological Implant) IMPLANT
RESERVOIR FLAT IZ 100ML (Miscellaneous) IMPLANT
RETRACTOR DEEP SCROTAL PENILE (MISCELLANEOUS) IMPLANT
RETRACTOR WILSON SYSTEM (INSTRUMENTS) IMPLANT
SET COLLECT BLD 21X.75 12 PB G (NEEDLE) IMPLANT
SURGILUBE 2OZ TUBE FLIPTOP (MISCELLANEOUS) IMPLANT
SUT ETHILON 3 0 PS 1 (SUTURE) IMPLANT
SUT MNCRL AB 4-0 PS2 18 (SUTURE) ×1 IMPLANT
SUT VIC AB 2-0 UR6 27 (SUTURE) ×4 IMPLANT
SUT VIC AB 3-0 SH 27 (SUTURE) ×4
SUT VIC AB 3-0 SH 27X BRD (SUTURE) ×2 IMPLANT
SYR 10ML LL (SYRINGE) ×2 IMPLANT
SYR 50ML LL SCALE MARK (SYRINGE) ×3 IMPLANT
SYR CONTROL 10ML LL (SYRINGE) ×1 IMPLANT
TOWEL GREEN STERILE FF (TOWEL DISPOSABLE) ×1 IMPLANT
TOWEL OR 17X26 10 PK STRL BLUE (TOWEL DISPOSABLE) ×2 IMPLANT
WATER STERILE IRR 500ML POUR (IV SOLUTION) ×1 IMPLANT
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
## 2023-06-04 NOTE — Transfer of Care (Signed)
Immediate Anesthesia Transfer of Care Note  Patient: Juan Olson  Procedure(s) Performed: INSERTION OF INFLATABLE PENILE PROTHESIS  Patient Location: PACU  Anesthesia Type:General  Level of Consciousness: awake, alert , oriented, and patient cooperative  Airway & Oxygen Therapy: Patient Spontanous Breathing and Patient connected to face mask oxygen  Post-op Assessment: Report given to RN, Post -op Vital signs reviewed and stable, and Patient moving all extremities  Post vital signs: Reviewed and stable  Last Vitals:  Vitals Value Taken Time  BP 159/94 06/04/23 0937  Temp    Pulse 40 06/04/23 0938  Resp 20 06/04/23 0938  SpO2 96 % 06/04/23 0938  Vitals shown include unfiled device data.  Last Pain:  Vitals:   06/04/23 0622  TempSrc: Oral  PainSc:          Complications: No notable events documented.

## 2023-06-04 NOTE — Anesthesia Procedure Notes (Signed)
Procedure Name: Intubation Date/Time: 06/04/2023 7:38 AM  Performed by: Johnette Abraham, CRNAPre-anesthesia Checklist: Patient identified, Emergency Drugs available, Suction available and Patient being monitored Patient Re-evaluated:Patient Re-evaluated prior to induction Oxygen Delivery Method: Circle System Utilized Preoxygenation: Pre-oxygenation with 100% oxygen Induction Type: IV induction Ventilation: Mask ventilation without difficulty Laryngoscope Size: Mac and 4 Grade View: Grade III Tube type: Oral Tube size: 8.0 mm Number of attempts: 1 Airway Equipment and Method: Stylet and Oral airway Placement Confirmation: ETT inserted through vocal cords under direct vision, positive ETCO2 and breath sounds checked- equal and bilateral Secured at: 22 cm Tube secured with: Tape Dental Injury: Teeth and Oropharynx as per pre-operative assessment

## 2023-06-04 NOTE — Discharge Instructions (Signed)
Penile prosthesis postoperative instructions  Wound:  In most cases your incision will have absorbable sutures that will dissolve within the first 10-20 days. Some will fall out even earlier. Expect some redness as the sutures dissolved but this should occur only around the sutures. If there is generalized redness, especially with increasing pain or swelling, let us know. The scrotum and penis will very likely get "black and blue" as the blood in the tissues spread. Sometimes the whole scrotum will turn colors. The black and blue is followed by a yellow and brown color. In time, all the discoloration will go away. In some cases some firm swelling in the area of the testicle and pump may persist for up to 4-6 weeks after the surgery and is considered normal in most cases.  Drain:   You may be discharged home with a drain in place. If so, you will be taught how to empty it and should keep track of the output. Additionally, you should call the office to arrange for an appointment to have it removed after a few days.   Diet:  You may return to your normal diet within 24 hours following your surgery. You may note some mild nausea and possibly vomiting the first 6-8 hours following surgery. This is usually due to the side effects of anesthesia, and will disappear quite soon. I would suggest clear liquids and a very light meal the first evening following your surgery.  Activity:  Your physical activity should be restricted the first 48 hours. During that time you should remain relatively inactive, moving about only when necessary. During the first 3 weeks following surgery you should avoid lifting any heavy objects (anything greater than 15 pounds), and avoid strenuous exercise. If you work, ask us specifically about your restrictions, both for work and home. We will write a note to your employer if needed.  Avoid using your penis until your follow up visit with Dr Machen, which will typically be around  3-4 weeks following the surgery. Most people are able to start cycling their device after that appointment, and can have intercourse soon thereafter.   You should plan to wear a tight pair of jockey shorts or an athletic supporter for the first 4-5 days, even to sleep. This will keep the scrotum immobilized to some degree and keep the swelling down.The position of your penis will determine what is most comfortable but I strongly urge you to keep the penis in the "up" position (toward your head). You should continue to tuck "up" your penis when possible for the first 3 months following surgery.  Ice packs should be placed on and off over the scrotum for the first 48 hours. Frozen peas or corn in a ZipLock bag can be frozen, used and re-frozen. Fifteen minutes on and 15 minutes off is a reasonable schedule. The ice is a good pain reliever and keeps the swelling down.  Hygiene:  You may shower 48 hours after your surgery. Tub bathing should be restricted until the wound is completely healed, typically around 2-3 weeks.  Medication:  You will be sent home with some type of pain medication. In many cases you will be sent home with a strong anti-inflammatory medication (Celebrex, Meloxicam) and a narcotic pain pill (hydrocodone or oxycodone). You can also supplement these medications with tylenol (acetaminophen). If the pain medication you are sent home with does not control the pain, please notify the office Problems you should report to us:  Fever of 101.0 degrees   Fahrenheit or greater. Moderate or severe swelling under the skin incision or involving the scrotum. Drug reaction such as hives, a rash, nausea or vomiting.  

## 2023-06-04 NOTE — Anesthesia Postprocedure Evaluation (Signed)
Anesthesia Post Note  Patient: Juan Olson  Procedure(s) Performed: INSERTION OF INFLATABLE PENILE PROTHESIS     Patient location during evaluation: PACU Anesthesia Type: General Level of consciousness: awake and alert Pain management: pain level controlled Vital Signs Assessment: post-procedure vital signs reviewed and stable Respiratory status: spontaneous breathing, nonlabored ventilation, respiratory function stable and patient connected to nasal cannula oxygen Cardiovascular status: blood pressure returned to baseline and stable Postop Assessment: no apparent nausea or vomiting Anesthetic complications: no  No notable events documented.  Last Vitals:  Vitals:   06/04/23 1000 06/04/23 1015  BP: 137/77 (!) 152/99  Pulse: 73 (!) 55  Resp: 13 14  Temp:  36.7 C  SpO2: 97% 97%    Last Pain:  Vitals:   06/04/23 1010  TempSrc:   PainSc: 2                  [name]

## 2023-06-04 NOTE — Op Note (Signed)
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 He has elected to proceed with prosthesis implantation.  ANESTHESIA:  General  EBL:  Minimal  Device: 3 piece AMS CX 700: 100 cc reservoir, 24 cm cylinders and 0.5 cm rear-tip extenders on right and left sides  LOCAL MEDICATIONS USED:  None  SPECIMEN: None  DISPOSITION OF SPECIMEN:  N/A  Description of procedure: The patient was taken to the major operating room, placed on the table and administered general anesthesia in the supine position. His genitalia was then prepped with chlorhexidine x 2. He was draped in the usual sterile fashion, and I used [place] on the field. An official timeout was then performed.  A 14 French coude catheter was then placed in the bladder and the bladder was drained and the catheter was plugged. A midline penoscrotal incision was then made and the dissection was carried down to the corpora and urethra. The lonestar retractor was positioned so as to have excellent exposure. 2-0 Vicryl sutures were then placed proximally in each corpus cavernosum to serve as stay sutures. An incision was then made in the corpus cavernosum first on the left-hand side with the bovie. Jen Mow were used to gently dilate the opening. I then dilated the corpus cavernosum with the a 12 Fr brooks dilator distally and proximally. Field goal post tests were performed and there was no evidence of perforations or crossover. I then irrigated the corpus cavernosum with antibiotic solution and measured the distance proximally and distally from the stay suture and was found to be 13 and 11 cm, respectively.I then turned my attention to the  contralateral corpus cavernosum and placed my stay sutures, made my corporotomy and dilated the corpus cavernosum in an identical fashion. This was measured and also was found to be 13.5 cm proximally and 11 distally. It was irrigated with anastomotic solution as was the scrotum. I then chose an 24 cm cylinder set with 0.5 cm rear-tip extenders and these were prepped while I prepared the site for reservoir placement.  I then digitally probed into the Left external inguinal ring. My finger was used to poke through the posterior wall of the ring. I used my finger to ensure I was in the appropriate space, and to clear room for the reservoir. I irrigated the space with anastomotic solution and then placed the reservoir in this location. I then filled the reservoir with 95 cc of sterile saline, and checked to confirm proper position. There was minimal backpressure with the reservoir max-filled.  Attention was redirected to the corporotomies where the cylinders were then placed by first fixing the suture to the distal aspect of the right cylinder to a straight needle. This was then loaded on the [facility] inserter and passed through the corporotomy and distally. I then advanced the straight needle with the [place]low inserter out through the glans and this was grasped with a hemostat and pulled through the glans and the suture was secured with a hemostat. I then performed an identical maneuver on the contralateral side. After this was performed I irrigated both corpus cavernosum; there was no evidence of urethral perforation. I inserted the distal portion of the cylinder through the corporotomies and pulled this to the end of the  corpora with the suture. The proximal aspect with the rear-tip extender was then passed through the corporotomy and into the seated position on each side. I then connected reservoir tubing to a syringe filled with sterile saline and inflated the device. I noted a good straight erection with both  cylinders equidistant under the glans and no buckling of the cylinders. I therefore deflated the device and closed the corporotomies with used my previously placed stay sutures.   I then grasped the scrotal skin in the midline with a babcock, and used a hemostat to dissect down to the dependent-most portion of the scrotum. The nasal speculum was inserted into this space, and facilitated placement of the pump. The cylinder was then connected to the pump after excising the excess tubing with appropriate shodded hemostats in place and then I used the supplied connectors to make the connection. I then again cycled the device with the pump and it cycled properly. I deflated the device and pumped it up about three quarters of the way to aid with hemostasis. I irrigated the wound one last time with antibiotic irrigation and then closed the deep scrotal tissue over the tubing and pump with running 3-0 monocryl suture. I placed a 10 Fr blake drain over the corporotomies. A second layer was then closed over this first layer with running 3- 0 monocryl, and running skin suture w/ 4-0 monocryl performed. Incision dressed with dermabond.  A mummy wrap was applied. The catheter was connected to closed system drainage, and drain connected to suction bulb and the patient was awakened and taken recovery room in stable and satisfactory condition. He tolerated the procedure well and there were no intraoperative complications. Needle sponge and instrument counts were correct at the end of the operation.

## 2023-06-05 ENCOUNTER — Encounter (HOSPITAL_COMMUNITY): Payer: Self-pay | Admitting: Urology

## 2023-09-05 DIAGNOSIS — I493 Ventricular premature depolarization: Secondary | ICD-10-CM | POA: Diagnosis not present

## 2023-09-05 DIAGNOSIS — E7800 Pure hypercholesterolemia, unspecified: Secondary | ICD-10-CM | POA: Diagnosis not present

## 2023-09-05 DIAGNOSIS — I10 Essential (primary) hypertension: Secondary | ICD-10-CM | POA: Diagnosis not present

## 2023-09-05 DIAGNOSIS — I509 Heart failure, unspecified: Secondary | ICD-10-CM | POA: Diagnosis not present

## 2023-09-05 DIAGNOSIS — Z86718 Personal history of other venous thrombosis and embolism: Secondary | ICD-10-CM | POA: Diagnosis not present

## 2023-09-05 DIAGNOSIS — E119 Type 2 diabetes mellitus without complications: Secondary | ICD-10-CM | POA: Diagnosis not present

## 2023-09-05 DIAGNOSIS — M792 Neuralgia and neuritis, unspecified: Secondary | ICD-10-CM | POA: Diagnosis not present

## 2023-09-05 DIAGNOSIS — I77810 Thoracic aortic ectasia: Secondary | ICD-10-CM | POA: Diagnosis not present

## 2023-09-12 DIAGNOSIS — R0781 Pleurodynia: Secondary | ICD-10-CM | POA: Diagnosis not present

## 2023-09-12 DIAGNOSIS — M25512 Pain in left shoulder: Secondary | ICD-10-CM | POA: Diagnosis not present

## 2023-09-24 DIAGNOSIS — Z9181 History of falling: Secondary | ICD-10-CM | POA: Diagnosis not present

## 2023-09-24 DIAGNOSIS — Z139 Encounter for screening, unspecified: Secondary | ICD-10-CM | POA: Diagnosis not present

## 2023-09-24 DIAGNOSIS — Z0000 Encounter for general adult medical examination without abnormal findings: Secondary | ICD-10-CM | POA: Diagnosis not present

## 2023-11-12 DIAGNOSIS — I5032 Chronic diastolic (congestive) heart failure: Secondary | ICD-10-CM | POA: Diagnosis not present

## 2023-11-12 DIAGNOSIS — M792 Neuralgia and neuritis, unspecified: Secondary | ICD-10-CM | POA: Diagnosis not present

## 2023-11-12 DIAGNOSIS — Z79899 Other long term (current) drug therapy: Secondary | ICD-10-CM | POA: Diagnosis not present

## 2023-11-12 DIAGNOSIS — Z86718 Personal history of other venous thrombosis and embolism: Secondary | ICD-10-CM | POA: Diagnosis not present

## 2023-11-12 DIAGNOSIS — I493 Ventricular premature depolarization: Secondary | ICD-10-CM | POA: Diagnosis not present

## 2023-11-12 DIAGNOSIS — I10 Essential (primary) hypertension: Secondary | ICD-10-CM | POA: Diagnosis not present

## 2023-11-12 DIAGNOSIS — E7800 Pure hypercholesterolemia, unspecified: Secondary | ICD-10-CM | POA: Diagnosis not present

## 2023-11-12 DIAGNOSIS — E119 Type 2 diabetes mellitus without complications: Secondary | ICD-10-CM | POA: Diagnosis not present

## 2023-11-21 DIAGNOSIS — L570 Actinic keratosis: Secondary | ICD-10-CM | POA: Diagnosis not present

## 2023-11-21 DIAGNOSIS — D225 Melanocytic nevi of trunk: Secondary | ICD-10-CM | POA: Diagnosis not present

## 2023-11-21 DIAGNOSIS — Z85828 Personal history of other malignant neoplasm of skin: Secondary | ICD-10-CM | POA: Diagnosis not present

## 2023-11-21 DIAGNOSIS — L821 Other seborrheic keratosis: Secondary | ICD-10-CM | POA: Diagnosis not present

## 2023-11-21 DIAGNOSIS — D2261 Melanocytic nevi of right upper limb, including shoulder: Secondary | ICD-10-CM | POA: Diagnosis not present

## 2023-11-21 DIAGNOSIS — D2262 Melanocytic nevi of left upper limb, including shoulder: Secondary | ICD-10-CM | POA: Diagnosis not present

## 2023-11-21 DIAGNOSIS — L820 Inflamed seborrheic keratosis: Secondary | ICD-10-CM | POA: Diagnosis not present

## 2023-11-21 DIAGNOSIS — L538 Other specified erythematous conditions: Secondary | ICD-10-CM | POA: Diagnosis not present

## 2024-02-20 DIAGNOSIS — I10 Essential (primary) hypertension: Secondary | ICD-10-CM | POA: Diagnosis not present

## 2024-02-20 DIAGNOSIS — I5032 Chronic diastolic (congestive) heart failure: Secondary | ICD-10-CM | POA: Diagnosis not present

## 2024-02-20 DIAGNOSIS — M792 Neuralgia and neuritis, unspecified: Secondary | ICD-10-CM | POA: Diagnosis not present

## 2024-02-20 DIAGNOSIS — E119 Type 2 diabetes mellitus without complications: Secondary | ICD-10-CM | POA: Diagnosis not present

## 2024-02-20 DIAGNOSIS — E7800 Pure hypercholesterolemia, unspecified: Secondary | ICD-10-CM | POA: Diagnosis not present

## 2024-02-20 DIAGNOSIS — I493 Ventricular premature depolarization: Secondary | ICD-10-CM | POA: Diagnosis not present

## 2024-02-20 DIAGNOSIS — I77810 Thoracic aortic ectasia: Secondary | ICD-10-CM | POA: Diagnosis not present

## 2024-03-11 DIAGNOSIS — M1711 Unilateral primary osteoarthritis, right knee: Secondary | ICD-10-CM | POA: Diagnosis not present

## 2024-03-11 DIAGNOSIS — M25561 Pain in right knee: Secondary | ICD-10-CM | POA: Diagnosis not present

## 2024-04-06 DIAGNOSIS — I341 Nonrheumatic mitral (valve) prolapse: Secondary | ICD-10-CM | POA: Diagnosis not present

## 2024-04-06 DIAGNOSIS — E7800 Pure hypercholesterolemia, unspecified: Secondary | ICD-10-CM | POA: Diagnosis not present

## 2024-04-06 DIAGNOSIS — I742 Embolism and thrombosis of arteries of the upper extremities: Secondary | ICD-10-CM | POA: Diagnosis not present

## 2024-04-06 DIAGNOSIS — I77810 Thoracic aortic ectasia: Secondary | ICD-10-CM | POA: Diagnosis not present

## 2024-04-06 DIAGNOSIS — I159 Secondary hypertension, unspecified: Secondary | ICD-10-CM | POA: Diagnosis not present

## 2024-04-06 DIAGNOSIS — I420 Dilated cardiomyopathy: Secondary | ICD-10-CM | POA: Diagnosis not present

## 2024-04-22 DIAGNOSIS — G4733 Obstructive sleep apnea (adult) (pediatric): Secondary | ICD-10-CM | POA: Diagnosis not present

## 2024-04-22 DIAGNOSIS — E119 Type 2 diabetes mellitus without complications: Secondary | ICD-10-CM | POA: Diagnosis not present

## 2024-04-22 DIAGNOSIS — I5020 Unspecified systolic (congestive) heart failure: Secondary | ICD-10-CM | POA: Diagnosis not present

## 2024-04-24 DIAGNOSIS — M1711 Unilateral primary osteoarthritis, right knee: Secondary | ICD-10-CM | POA: Diagnosis not present

## 2024-05-01 DIAGNOSIS — M1711 Unilateral primary osteoarthritis, right knee: Secondary | ICD-10-CM | POA: Diagnosis not present

## 2024-05-11 DIAGNOSIS — M1711 Unilateral primary osteoarthritis, right knee: Secondary | ICD-10-CM | POA: Diagnosis not present

## 2024-05-12 DIAGNOSIS — M1712 Unilateral primary osteoarthritis, left knee: Secondary | ICD-10-CM | POA: Diagnosis not present

## 2024-05-12 DIAGNOSIS — M25562 Pain in left knee: Secondary | ICD-10-CM | POA: Diagnosis not present

## 2024-05-28 DIAGNOSIS — G4733 Obstructive sleep apnea (adult) (pediatric): Secondary | ICD-10-CM | POA: Diagnosis not present

## 2024-06-16 DIAGNOSIS — G4733 Obstructive sleep apnea (adult) (pediatric): Secondary | ICD-10-CM | POA: Diagnosis not present

## 2024-06-26 DIAGNOSIS — M1712 Unilateral primary osteoarthritis, left knee: Secondary | ICD-10-CM | POA: Diagnosis not present

## 2024-07-03 DIAGNOSIS — M1712 Unilateral primary osteoarthritis, left knee: Secondary | ICD-10-CM | POA: Diagnosis not present

## 2024-07-10 DIAGNOSIS — M1712 Unilateral primary osteoarthritis, left knee: Secondary | ICD-10-CM | POA: Diagnosis not present

## 2024-07-16 ENCOUNTER — Encounter: Payer: Self-pay | Admitting: Pharmacist

## 2024-07-16 NOTE — Progress Notes (Signed)
   07/16/2024  Patient ID: Juan Olson, male   DOB: 1956-06-12, 68 y.o.   MRN: [id number]  Pharmacy Quality Measure Review  This patient is appearing on a report for being at risk of failing the adherence measure for hypertension (ACEi/ARB) medications this calendar year.   Medication: Losartan  100 mg  Last fill date: 06/22/24  for 90 day supply  Insurance report was not up to date. No action needed at this time.   [name], PharmD, BCACP Clinical Pharmacist [phone]

## 2024-07-22 DIAGNOSIS — Z79899 Other long term (current) drug therapy: Secondary | ICD-10-CM | POA: Diagnosis not present

## 2024-07-22 DIAGNOSIS — E7800 Pure hypercholesterolemia, unspecified: Secondary | ICD-10-CM | POA: Diagnosis not present

## 2024-07-22 DIAGNOSIS — M792 Neuralgia and neuritis, unspecified: Secondary | ICD-10-CM | POA: Diagnosis not present

## 2024-07-22 DIAGNOSIS — I10 Essential (primary) hypertension: Secondary | ICD-10-CM | POA: Diagnosis not present

## 2024-07-22 DIAGNOSIS — I5020 Unspecified systolic (congestive) heart failure: Secondary | ICD-10-CM | POA: Diagnosis not present

## 2024-07-22 DIAGNOSIS — I493 Ventricular premature depolarization: Secondary | ICD-10-CM | POA: Diagnosis not present

## 2024-07-22 DIAGNOSIS — I77810 Thoracic aortic ectasia: Secondary | ICD-10-CM | POA: Diagnosis not present

## 2024-07-22 DIAGNOSIS — E119 Type 2 diabetes mellitus without complications: Secondary | ICD-10-CM | POA: Diagnosis not present

## 2024-07-28 DIAGNOSIS — M542 Cervicalgia: Secondary | ICD-10-CM | POA: Diagnosis not present

## 2024-07-28 DIAGNOSIS — G8929 Other chronic pain: Secondary | ICD-10-CM | POA: Diagnosis not present

## 2024-07-28 DIAGNOSIS — M5416 Radiculopathy, lumbar region: Secondary | ICD-10-CM | POA: Diagnosis not present

## 2024-07-28 DIAGNOSIS — M5412 Radiculopathy, cervical region: Secondary | ICD-10-CM | POA: Diagnosis not present

## 2024-07-28 DIAGNOSIS — M5441 Lumbago with sciatica, right side: Secondary | ICD-10-CM | POA: Diagnosis not present

## 2024-07-29 ENCOUNTER — Other Ambulatory Visit: Payer: Self-pay | Admitting: Physical Medicine & Rehabilitation

## 2024-07-29 DIAGNOSIS — G8929 Other chronic pain: Secondary | ICD-10-CM

## 2024-07-29 DIAGNOSIS — M5412 Radiculopathy, cervical region: Secondary | ICD-10-CM

## 2024-07-31 ENCOUNTER — Other Ambulatory Visit

## 2024-08-06 ENCOUNTER — Ambulatory Visit
Admission: RE | Admit: 2024-08-06 | Discharge: 2024-08-06 | Disposition: A | Discharge status: Home / Self Care | Source: Ambulatory Visit | Attending: Physical Medicine & Rehabilitation | Admitting: Physical Medicine & Rehabilitation

## 2024-08-06 DIAGNOSIS — M48061 Spinal stenosis, lumbar region without neurogenic claudication: Secondary | ICD-10-CM | POA: Diagnosis not present

## 2024-08-06 DIAGNOSIS — M4727 Other spondylosis with radiculopathy, lumbosacral region: Secondary | ICD-10-CM | POA: Diagnosis not present

## 2024-08-06 DIAGNOSIS — I159 Secondary hypertension, unspecified: Secondary | ICD-10-CM | POA: Diagnosis not present

## 2024-08-06 DIAGNOSIS — Z23 Encounter for immunization: Secondary | ICD-10-CM | POA: Diagnosis not present

## 2024-08-06 DIAGNOSIS — M4802 Spinal stenosis, cervical region: Secondary | ICD-10-CM | POA: Diagnosis not present

## 2024-08-06 DIAGNOSIS — M5117 Intervertebral disc disorders with radiculopathy, lumbosacral region: Secondary | ICD-10-CM | POA: Diagnosis not present

## 2024-08-06 DIAGNOSIS — M5412 Radiculopathy, cervical region: Secondary | ICD-10-CM

## 2024-08-06 DIAGNOSIS — E7800 Pure hypercholesterolemia, unspecified: Secondary | ICD-10-CM | POA: Diagnosis not present

## 2024-08-06 DIAGNOSIS — I420 Dilated cardiomyopathy: Secondary | ICD-10-CM | POA: Diagnosis not present

## 2024-08-06 DIAGNOSIS — G8929 Other chronic pain: Secondary | ICD-10-CM

## 2024-08-06 DIAGNOSIS — M4722 Other spondylosis with radiculopathy, cervical region: Secondary | ICD-10-CM | POA: Diagnosis not present

## 2024-08-06 DIAGNOSIS — I341 Nonrheumatic mitral (valve) prolapse: Secondary | ICD-10-CM | POA: Diagnosis not present

## 2024-08-06 DIAGNOSIS — I742 Embolism and thrombosis of arteries of the upper extremities: Secondary | ICD-10-CM | POA: Diagnosis not present

## 2024-08-06 DIAGNOSIS — I77810 Thoracic aortic ectasia: Secondary | ICD-10-CM | POA: Diagnosis not present

## 2024-08-11 DIAGNOSIS — I7389 Other specified peripheral vascular diseases: Secondary | ICD-10-CM | POA: Diagnosis not present

## 2024-08-11 DIAGNOSIS — G5621 Lesion of ulnar nerve, right upper limb: Secondary | ICD-10-CM | POA: Diagnosis not present

## 2024-08-19 ENCOUNTER — Ambulatory Visit: Attending: Physical Medicine & Rehabilitation | Admitting: Physical Therapy

## 2024-08-19 DIAGNOSIS — M48062 Spinal stenosis, lumbar region with neurogenic claudication: Secondary | ICD-10-CM | POA: Insufficient documentation

## 2024-08-19 DIAGNOSIS — M5459 Other low back pain: Secondary | ICD-10-CM | POA: Diagnosis present
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
## 2024-08-26 ENCOUNTER — Telehealth: Payer: Self-pay | Admitting: Physical Therapy

## 2024-08-26 ENCOUNTER — Ambulatory Visit: Admitting: Physical Therapy

## 2024-08-26 NOTE — Telephone Encounter (Signed)
 Called pt to inquire about absence from PT. Pt reports forgetting about apt and that he felt really sore after last session and that he wants to meet with his pain doctor, Doctor Dodson before proceeding with any further physical therapy. PT instructed pt to call to cancel next time to avoid being removed from schedule due to no show policy.

## 2024-08-27 DIAGNOSIS — G5621 Lesion of ulnar nerve, right upper limb: Secondary | ICD-10-CM | POA: Diagnosis not present

## 2024-08-27 DIAGNOSIS — I7389 Other specified peripheral vascular diseases: Secondary | ICD-10-CM | POA: Diagnosis not present

## 2024-08-28 DIAGNOSIS — M5416 Radiculopathy, lumbar region: Secondary | ICD-10-CM | POA: Diagnosis not present

## 2024-08-28 DIAGNOSIS — M48061 Spinal stenosis, lumbar region without neurogenic claudication: Secondary | ICD-10-CM | POA: Diagnosis not present

## 2024-08-28 DIAGNOSIS — M792 Neuralgia and neuritis, unspecified: Secondary | ICD-10-CM | POA: Diagnosis not present

## 2024-08-28 DIAGNOSIS — M4802 Spinal stenosis, cervical region: Secondary | ICD-10-CM | POA: Diagnosis not present

## 2024-08-28 DIAGNOSIS — M545 Low back pain, unspecified: Secondary | ICD-10-CM | POA: Diagnosis not present

## 2024-09-09 ENCOUNTER — Ambulatory Visit: Attending: Physical Medicine & Rehabilitation | Admitting: Physical Therapy

## 2024-09-09 DIAGNOSIS — M48062 Spinal stenosis, lumbar region with neurogenic claudication: Secondary | ICD-10-CM | POA: Insufficient documentation

## 2024-09-09 DIAGNOSIS — M5459 Other low back pain: Secondary | ICD-10-CM | POA: Insufficient documentation
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
## 2024-09-15 ENCOUNTER — Ambulatory Visit: Admitting: Physical Therapy
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
## 2024-09-22 ENCOUNTER — Ambulatory Visit: Admitting: Physical Therapy

## 2024-09-24 ENCOUNTER — Encounter: Admitting: Physical Therapy

## 2024-10-06 ENCOUNTER — Encounter: Admitting: Physical Therapy

## 2024-10-08 ENCOUNTER — Encounter: Admitting: Physical Therapy

## 2024-10-13 ENCOUNTER — Encounter: Admitting: Physical Therapy

## 2024-10-15 ENCOUNTER — Encounter: Admitting: Physical Therapy

## 2024-10-20 ENCOUNTER — Encounter: Admitting: Physical Therapy

## 2024-10-22 ENCOUNTER — Encounter: Admitting: Physical Therapy
